# Patient Record
Sex: Female | Born: 1960
Health system: Southern US, Community
[De-identification: ages and names within clinical notes are randomized; demographics above are authoritative.]

## PROBLEM LIST (undated history)

## (undated) DIAGNOSIS — E559 Vitamin D deficiency, unspecified: Secondary | ICD-10-CM

## (undated) DIAGNOSIS — T7840XA Allergy, unspecified, initial encounter: Secondary | ICD-10-CM

## (undated) HISTORY — DX: Allergy, unspecified, initial encounter: T78.40XA

## (undated) HISTORY — PX: TUBAL LIGATION: SHX77

## (undated) HISTORY — DX: Vitamin D deficiency, unspecified: E55.9

## (undated) HISTORY — PX: WRIST SURGERY: SHX841

---

## 2004-01-15 ENCOUNTER — Ambulatory Visit: Payer: Self-pay | Admitting: General Practice

## 2005-01-25 ENCOUNTER — Ambulatory Visit: Payer: Self-pay | Admitting: General Practice

## 2005-02-07 ENCOUNTER — Ambulatory Visit: Payer: Self-pay | Admitting: Family Medicine

## 2006-02-07 ENCOUNTER — Ambulatory Visit: Payer: Self-pay | Admitting: General Practice

## 2006-08-04 ENCOUNTER — Emergency Department: Payer: Self-pay | Admitting: Emergency Medicine

## 2006-08-10 ENCOUNTER — Ambulatory Visit: Payer: Self-pay | Admitting: Unknown Physician Specialty

## 2006-08-11 ENCOUNTER — Ambulatory Visit: Payer: Self-pay | Admitting: Unknown Physician Specialty

## 2012-01-04 ENCOUNTER — Ambulatory Visit: Payer: Self-pay

## 2012-02-06 ENCOUNTER — Ambulatory Visit: Payer: Self-pay | Admitting: Gastroenterology

## 2012-02-06 LAB — HM COLONOSCOPY

## 2012-08-06 ENCOUNTER — Emergency Department: Payer: Self-pay | Admitting: Emergency Medicine

## 2012-08-06 LAB — COMPREHENSIVE METABOLIC PANEL
Albumin: 3.8 g/dL (ref 3.4–5.0)
BUN: 18 mg/dL (ref 7–18)
Bilirubin,Total: 0.5 mg/dL (ref 0.2–1.0)
Creatinine: 0.56 mg/dL — ABNORMAL LOW (ref 0.60–1.30)
Glucose: 112 mg/dL — ABNORMAL HIGH (ref 65–99)
Osmolality: 278 (ref 275–301)
Potassium: 4.2 mmol/L (ref 3.5–5.1)
SGPT (ALT): 33 U/L (ref 12–78)

## 2012-08-06 LAB — URINALYSIS, COMPLETE
Bilirubin,UR: NEGATIVE
Leukocyte Esterase: NEGATIVE
Protein: 30
Squamous Epithelial: 16

## 2012-08-06 LAB — CBC
HCT: 41.9 % (ref 35.0–47.0)
HGB: 14.7 g/dL (ref 12.0–16.0)
MCV: 88 fL (ref 80–100)
Platelet: 216 10*3/uL (ref 150–440)
RDW: 12.7 % (ref 11.5–14.5)

## 2012-08-06 LAB — TROPONIN I: Troponin-I: <0.02 ng/mL

## 2013-02-14 ENCOUNTER — Ambulatory Visit: Payer: Self-pay

## 2013-06-26 ENCOUNTER — Ambulatory Visit: Payer: Self-pay

## 2014-02-14 LAB — HM PAP SMEAR

## 2014-02-24 ENCOUNTER — Ambulatory Visit: Payer: Self-pay

## 2014-02-24 LAB — HM MAMMOGRAPHY

## 2014-07-16 ENCOUNTER — Ambulatory Visit (INDEPENDENT_AMBULATORY_CARE_PROVIDER_SITE_OTHER): Payer: Commercial Managed Care - PPO | Admitting: Podiatry

## 2014-07-16 ENCOUNTER — Ambulatory Visit (INDEPENDENT_AMBULATORY_CARE_PROVIDER_SITE_OTHER): Payer: Commercial Managed Care - PPO

## 2014-07-16 ENCOUNTER — Encounter: Payer: Self-pay | Admitting: Podiatry

## 2014-07-16 VITALS — BP 123/75 | HR 62 | Resp 16 | Ht 65.0 in | Wt 184.0 lb

## 2014-07-16 DIAGNOSIS — M2042 Other hammer toe(s) (acquired), left foot: Secondary | ICD-10-CM

## 2014-07-16 NOTE — Progress Notes (Signed)
   Subjective:    Patient ID: Jeanette Henry, female    DOB: 1960/05/18, 54 y.o.   MRN: [id number]  HPI the left pinky toe has a soft corn on it. It only hurts depending on the shoe i wear     Review of Systems  All other systems reviewed and are negative.      Objective:   Physical Exam: I have reviewed her past medical history medications allergies surgery social history and review of systems. Pulses are strongly palpable left foot. Neurologic sensorium is intact. Deep tendon reflexes intact bilateral muscle strength +5 over 5 dorsiflexion plantar flexors and inverters inverters MUSCULATURES intact. Orthopedic evaluation of his joints all joints distal to the ankle for range of motion or crepitation. Cutaneous evaluation of straight supple well-hydrated cutis adductor varus rotated hammertoe deformity with reactive hyperkeratosis to the medial aspect fifth digit left foot.        Assessment & Plan:  Assessment: Digital exostosis with adductor varus rotated hammertoe deformity fifth left. Porokeratosis fifth left.  Plan: Discussed etiology pathology conservative versus surgical therapies. Direct hyperkeratosis discussed surgery in detail follow-up with her as needed.

## 2014-12-08 DIAGNOSIS — E559 Vitamin D deficiency, unspecified: Secondary | ICD-10-CM | POA: Insufficient documentation

## 2014-12-09 ENCOUNTER — Ambulatory Visit (INDEPENDENT_AMBULATORY_CARE_PROVIDER_SITE_OTHER): Payer: Commercial Managed Care - PPO | Admitting: Unknown Physician Specialty

## 2014-12-09 ENCOUNTER — Encounter: Payer: Self-pay | Admitting: Unknown Physician Specialty

## 2014-12-09 ENCOUNTER — Ambulatory Visit: Payer: Commercial Managed Care - PPO | Admitting: Unknown Physician Specialty

## 2014-12-09 VITALS — BP 137/81 | HR 66 | Temp 97.9°F | Ht 63.9 in | Wt 187.5 lb

## 2014-12-09 DIAGNOSIS — Z0000 Encounter for general adult medical examination without abnormal findings: Secondary | ICD-10-CM | POA: Diagnosis not present

## 2014-12-09 LAB — CBC WITH DIFFERENTIAL/PLATELET
BASOS: 1 %
Basophils Absolute: 0 10*3/uL (ref 0.0–0.2)
EOS (ABSOLUTE): 0 10*3/uL (ref 0.0–0.4)
EOS: 0 %
HEMOGLOBIN: 14.2 g/dL (ref 11.1–15.9)
Hematocrit: 41 % (ref 34.0–46.6)
IMMATURE GRANS (ABS): 0 10*3/uL (ref 0.0–0.1)
IMMATURE GRANULOCYTES: 0 %
Lymphocytes Absolute: 2.1 10*3/uL (ref 0.7–3.1)
Lymphs: 30 %
MCH: 30.8 pg (ref 26.6–33.0)
MCHC: 34.6 g/dL (ref 31.5–35.7)
MCV: 89 fL (ref 79–97)
MONOCYTES: 5 %
Monocytes Absolute: 0.4 10*3/uL (ref 0.1–0.9)
NEUTROS PCT: 64 %
Neutrophils Absolute: 4.5 10*3/uL (ref 1.4–7.0)
PLATELETS: 242 10*3/uL (ref 150–379)
RBC: 4.61 x10E6/uL (ref 3.77–5.28)
RDW: 13.3 % (ref 12.3–15.4)
WBC: 7.1 10*3/uL (ref 3.4–10.8)

## 2014-12-09 NOTE — Progress Notes (Signed)
   BP 137/81 mmHg  Pulse 66  Temp(Src) 97.9 F (36.6 C)  Ht 5' 3.9" (1.623 m)  Wt 187 lb 8 oz (85.049 kg)  BMI 32.29 kg/m2  SpO2 97%  LMP  (LMP Unknown)   Subjective:    Patient ID: Jeanette Henry, female    DOB: May 16, 1960, 54 y.o.   MRN: [id number]  HPI: Jeanette Henry is a 54 y.o. female  Chief Complaint  Patient presents with  . Annual Exam    Relevant past medical, surgical, family and social history reviewed and updated as indicated. Interim medical history since our last visit reviewed. Allergies and medications reviewed and updated.  Review of Systems  Constitutional: Negative.   HENT: Negative.   Eyes: Negative.   Respiratory: Negative.   Cardiovascular: Negative.   Gastrointestinal: Negative.   Endocrine: Negative.   Genitourinary: Negative.   Musculoskeletal: Negative.   Skin: Negative.   Allergic/Immunologic: Negative.   Neurological: Negative.   Hematological: Negative.   Psychiatric/Behavioral: Negative.     Per HPI unless specifically indicated above     Objective:    BP 137/81 mmHg  Pulse 66  Temp(Src) 97.9 F (36.6 C)  Ht 5' 3.9" (1.623 m)  Wt 187 lb 8 oz (85.049 kg)  BMI 32.29 kg/m2  SpO2 97%  LMP  (LMP Unknown)  Wt Readings from Last 3 Encounters:  12/09/14 187 lb 8 oz (85.049 kg)  02/14/14 181 lb (82.101 kg)  07/16/14 184 lb (83.462 kg)    Physical Exam  Constitutional: She is oriented to person, place, and time. She appears well-developed and well-nourished.  HENT:  Head: Normocephalic and atraumatic.  Eyes: Pupils are equal, round, and reactive to light. Right eye exhibits no discharge. Left eye exhibits no discharge. No scleral icterus.  Neck: Normal range of motion. Neck supple. Carotid bruit is not present. No thyromegaly present.  Cardiovascular: Normal rate, regular rhythm and normal heart sounds.  Exam reveals no gallop and no friction rub.   No murmur heard. Pulmonary/Chest: Effort normal and breath sounds normal.  No respiratory distress. She has no wheezes. She has no rales.  Abdominal: Soft. Bowel sounds are normal. There is no tenderness. There is no rebound.  Genitourinary: No breast swelling, tenderness or discharge.  Musculoskeletal: Normal range of motion.  Lymphadenopathy:    She has no cervical adenopathy.  Neurological: She is alert and oriented to person, place, and time.  Skin: Skin is warm, dry and intact. No rash noted.  Psychiatric: She has a normal mood and affect. Her speech is normal and behavior is normal. Judgment and thought content normal. Cognition and memory are normal.  Nursing note reviewed.      Assessment & Plan:   Problem List Items Addressed This Visit    None    Visit Diagnoses    Annual physical exam    -  Primary        Follow up plan: Return if symptoms worsen or fail to improve.

## 2014-12-09 NOTE — Progress Notes (Signed)
   LMP  (LMP Unknown)   Subjective:    Patient ID: Jeanette Henry, female    DOB: November 01, 1960, 54 y.o.   MRN: [id number]  HPI: Jeanette Henry is a 54 y.o. female  Here for wellness physical  Relevant past medical, surgical, family and social history reviewed and updated as indicated. Interim medical history since our last visit reviewed. Allergies and medications reviewed and updated.  Depression screen PHQ 2/9 12/09/2014  Decreased Interest 2  Down, Depressed, Hopeless 0  PHQ - 2 Score 2    Review of Systems  Constitutional: Negative.   HENT: Negative.   Eyes: Negative.   Respiratory: Negative.   Cardiovascular: Negative.   Gastrointestinal: Negative.   Endocrine: Negative.   Genitourinary: Negative.   Musculoskeletal: Negative.   Skin: Negative.   Allergic/Immunologic: Negative.   Neurological: Negative.   Hematological: Negative.   Psychiatric/Behavioral: Negative.     Per HPI unless specifically indicated above     Objective:    LMP  (LMP Unknown)  Wt Readings from Last 3 Encounters:  12/09/14 187 lb 8 oz (85.049 kg)  02/14/14 181 lb (82.101 kg)  07/16/14 184 lb (83.462 kg)    Physical Exam  Constitutional: She is oriented to person, place, and time. She appears well-developed and well-nourished.  HENT:  Head: Normocephalic and atraumatic.  Eyes: Pupils are equal, round, and reactive to light. Right eye exhibits no discharge. Left eye exhibits no discharge. No scleral icterus.  Neck: Normal range of motion. Neck supple. Carotid bruit is not present. No thyromegaly present.  Cardiovascular: Normal rate, regular rhythm and normal heart sounds.  Exam reveals no gallop and no friction rub.   No murmur heard. Pulmonary/Chest: Effort normal and breath sounds normal. No respiratory distress. She has no wheezes. She has no rales.  Abdominal: Soft. Bowel sounds are normal. There is no tenderness. There is no rebound.  Genitourinary: No breast swelling,  tenderness or discharge.  Musculoskeletal: Normal range of motion.  Lymphadenopathy:    She has no cervical adenopathy.  Neurological: She is alert and oriented to person, place, and time.  Skin: Skin is warm, dry and intact. No rash noted.  Psychiatric: She has a normal mood and affect. Her speech is normal and behavior is normal. Judgment and thought content normal. Cognition and memory are normal.       Assessment & Plan:   Problem List Items Addressed This Visit    None    Visit Diagnoses    Annual physical exam    -  Primary    Relevant Orders    CBC with Differential/Platelet    Comprehensive metabolic panel    Hepatitis C antibody    HIV antibody    TSH    Lipid Panel w/o Chol/HDL Ratio        Follow up plan: Return in about 1 year (around 12/09/2015).

## 2014-12-10 ENCOUNTER — Encounter: Payer: Self-pay | Admitting: Unknown Physician Specialty

## 2014-12-10 LAB — COMPREHENSIVE METABOLIC PANEL
A/G RATIO: 1.8 (ref 1.1–2.5)
ALK PHOS: 88 IU/L (ref 39–117)
ALT: 23 IU/L (ref 0–32)
AST: 24 IU/L (ref 0–40)
Albumin: 4.4 g/dL (ref 3.5–5.5)
BILIRUBIN TOTAL: 0.2 mg/dL (ref 0.0–1.2)
BUN/Creatinine Ratio: 18 (ref 9–23)
BUN: 14 mg/dL (ref 6–24)
CHLORIDE: 99 mmol/L (ref 97–108)
CO2: 26 mmol/L (ref 18–29)
Calcium: 9.6 mg/dL (ref 8.7–10.2)
Creatinine, Ser: 0.78 mg/dL (ref 0.57–1.00)
GFR calc Af Amer: 100 mL/min/{1.73_m2} (ref >59)
GFR calc non Af Amer: 87 mL/min/{1.73_m2} (ref >59)
GLUCOSE: 90 mg/dL (ref 65–99)
Globulin, Total: 2.4 g/dL (ref 1.5–4.5)
POTASSIUM: 4.6 mmol/L (ref 3.5–5.2)
Sodium: 139 mmol/L (ref 134–144)
Total Protein: 6.8 g/dL (ref 6.0–8.5)

## 2014-12-10 LAB — LIPID PANEL W/O CHOL/HDL RATIO
Cholesterol, Total: 219 mg/dL — ABNORMAL HIGH (ref 100–199)
HDL: 55 mg/dL (ref >39)
LDL Calculated: 133 mg/dL — ABNORMAL HIGH (ref 0–99)
Triglycerides: 154 mg/dL — ABNORMAL HIGH (ref 0–149)
VLDL Cholesterol Cal: 31 mg/dL (ref 5–40)

## 2014-12-10 LAB — HEPATITIS C ANTIBODY

## 2014-12-10 LAB — TSH: TSH: 1.88 u[IU]/mL (ref 0.450–4.500)

## 2014-12-10 LAB — HIV ANTIBODY (ROUTINE TESTING W REFLEX): HIV SCREEN 4TH GENERATION: NONREACTIVE

## 2015-01-06 ENCOUNTER — Ambulatory Visit (INDEPENDENT_AMBULATORY_CARE_PROVIDER_SITE_OTHER): Payer: Commercial Managed Care - PPO | Admitting: Unknown Physician Specialty

## 2015-01-06 ENCOUNTER — Encounter: Payer: Self-pay | Admitting: Unknown Physician Specialty

## 2015-01-06 VITALS — BP 125/81 | HR 65 | Temp 97.8°F | Ht 65.5 in | Wt 186.8 lb

## 2015-01-06 DIAGNOSIS — J019 Acute sinusitis, unspecified: Secondary | ICD-10-CM | POA: Diagnosis not present

## 2015-01-06 MED ORDER — AMOXICILLIN 875 MG PO TABS: 875.0000 mg | ORAL_TABLET | Freq: Two times a day (BID) | ORAL | Status: DC

## 2015-01-06 MED ORDER — FLUCONAZOLE 150 MG PO TABS: 150.0000 mg | ORAL_TABLET | Freq: Once | ORAL | Status: DC

## 2015-01-06 NOTE — Progress Notes (Signed)
    BP 125/81 mmHg  Pulse 65  Temp(Src) 97.8 F (36.6 C)  Ht 5' 5.5" (1.664 m)  Wt 186 lb 12.8 oz (84.732 kg)  BMI 30.60 kg/m2  SpO2 95%  LMP  (LMP Unknown)   Subjective:    Patient ID: Jeanette Henry, female    DOB: 05-Jun-1960, 54 y.o.   MRN: [id number]  HPI: Jeanette Henry is a 54 y.o. female  Chief Complaint  Patient presents with  . Sinusitis    pt states she has nasal congestion, drainage, cough, headache, and sore throat. States symptoms started over a week ago. States she has tried taking Mucinex and Sudaffed, but neither seemed to help   Sinusitis This is a new problem. The current episode started 1 to 4 weeks ago. The problem has been waxing and waning since onset. There has been no fever. She is experiencing no pain. Associated symptoms include chills, congestion, coughing, ear pain, headaches, a hoarse voice, sinus pressure and a sore throat. Past treatments include oral decongestants. The treatment provided no relief.    Relevant past medical, surgical, family and social history reviewed and updated as indicated. Interim medical history since our last visit reviewed. Allergies and medications reviewed and updated.  Review of Systems  Constitutional: Positive for chills.  HENT: Positive for congestion, ear pain, hoarse voice, sinus pressure and sore throat.   Respiratory: Positive for cough.   Neurological: Positive for headaches.    Per HPI unless specifically indicated above     Objective:    BP 125/81 mmHg  Pulse 65  Temp(Src) 97.8 F (36.6 C)  Ht 5' 5.5" (1.664 m)  Wt 186 lb 12.8 oz (84.732 kg)  BMI 30.60 kg/m2  SpO2 95%  LMP  (LMP Unknown)  Wt Readings from Last 3 Encounters:  01/06/15 186 lb 12.8 oz (84.732 kg)  12/09/14 187 lb 8 oz (85.049 kg)  02/14/14 181 lb (82.101 kg)    Physical Exam  Constitutional: She is oriented to person, place, and time. She appears well-developed and well-nourished. No distress.  HENT:  Head: Normocephalic  and atraumatic.  Right Ear: Tympanic membrane and ear canal normal.  Left Ear: Tympanic membrane and ear canal normal.  Nose: No rhinorrhea. Right sinus exhibits maxillary sinus tenderness. Right sinus exhibits no frontal sinus tenderness. Left sinus exhibits maxillary sinus tenderness. Left sinus exhibits no frontal sinus tenderness.  Eyes: Conjunctivae and lids are normal. Right eye exhibits no discharge. Left eye exhibits no discharge. No scleral icterus.  Cardiovascular: Normal rate and regular rhythm.   Pulmonary/Chest: Effort normal and breath sounds normal. No respiratory distress.  Abdominal: Normal appearance. There is no splenomegaly or hepatomegaly.  Musculoskeletal: Normal range of motion.  Neurological: She is alert and oriented to person, place, and time.  Skin: Skin is intact. No rash noted. No pallor.  Psychiatric: She has a normal mood and affect. Her behavior is normal. Judgment and thought content normal.      Assessment & Plan:   Problem List Items Addressed This Visit    None    Visit Diagnoses    Acute sinusitis, recurrence not specified, unspecified location    -  Primary    Relevant Medications    amoxicillin (AMOXIL) 875 MG tablet    fluconazole (DIFLUCAN) 150 MG tablet        Follow up plan: Return if symptoms worsen or fail to improve.

## 2015-10-20 ENCOUNTER — Encounter: Payer: Self-pay | Admitting: Unknown Physician Specialty

## 2015-10-20 ENCOUNTER — Ambulatory Visit (INDEPENDENT_AMBULATORY_CARE_PROVIDER_SITE_OTHER): Payer: Commercial Managed Care - PPO | Admitting: Unknown Physician Specialty

## 2015-10-20 VITALS — BP 122/76 | HR 67 | Temp 97.7°F | Ht 64.2 in | Wt 185.2 lb

## 2015-10-20 DIAGNOSIS — Z0000 Encounter for general adult medical examination without abnormal findings: Secondary | ICD-10-CM | POA: Diagnosis not present

## 2015-10-20 DIAGNOSIS — J309 Allergic rhinitis, unspecified: Secondary | ICD-10-CM | POA: Diagnosis not present

## 2015-10-20 DIAGNOSIS — Z23 Encounter for immunization: Secondary | ICD-10-CM

## 2015-10-20 DIAGNOSIS — B373 Candidiasis of vulva and vagina: Secondary | ICD-10-CM

## 2015-10-20 DIAGNOSIS — M791 Myalgia, unspecified site: Secondary | ICD-10-CM

## 2015-10-20 DIAGNOSIS — B3731 Acute candidiasis of vulva and vagina: Secondary | ICD-10-CM

## 2015-10-20 MED ORDER — FLUTICASONE PROPIONATE 50 MCG/ACT NA SUSP: 2.0000 | Freq: Every day | NASAL | 12 refills | Status: DC

## 2015-10-20 MED ORDER — FLUCONAZOLE 150 MG PO TABS: 150.0000 mg | ORAL_TABLET | Freq: Once | ORAL | 0 refills | Status: AC

## 2015-10-20 MED ORDER — FEXOFENADINE HCL 180 MG PO TABS: 180.0000 mg | ORAL_TABLET | Freq: Every day | ORAL | 3 refills | Status: DC

## 2015-10-20 NOTE — Patient Instructions (Signed)
Tdap Vaccine (Tetanus, Diphtheria and Pertussis): What You Need to Know 1. Why get vaccinated? Tetanus, diphtheria and pertussis are very serious diseases. Tdap vaccine can protect us from these diseases. And, Tdap vaccine given to pregnant women can protect newborn babies against pertussis. TETANUS (Lockjaw) is rare in the [place] today. It causes painful muscle tightening and stiffness, usually all over the body.  It can lead to tightening of muscles in the head and neck so you can't open your mouth, swallow, or sometimes even breathe. Tetanus kills about 1 out of 10 people who are infected even after receiving the best medical care. DIPHTHERIA is also rare in the [place] today. It can cause a thick coating to form in the back of the throat.  It can lead to breathing problems, heart failure, paralysis, and death. PERTUSSIS (Whooping Cough) causes severe coughing spells, which can cause difficulty breathing, vomiting and disturbed sleep.  It can also lead to weight loss, incontinence, and rib fractures. Up to 2 in 100 adolescents and 5 in 100 adults with pertussis are hospitalized or have complications, which could include pneumonia or death. These diseases are caused by bacteria. Diphtheria and pertussis are spread from person to person through secretions from coughing or sneezing. Tetanus enters the body through cuts, scratches, or wounds. Before vaccines, as many as 200,000 cases of diphtheria, 200,000 cases of pertussis, and hundreds of cases of tetanus, were reported in the [place] each year. Since vaccination began, reports of cases for tetanus and diphtheria have dropped by about 99% and for pertussis by about 80%. 2. Tdap vaccine Tdap vaccine can protect adolescents and adults from tetanus, diphtheria, and pertussis. One dose of Tdap is routinely given at age 11 or 12. People who did not get Tdap at that age should get it as soon as possible. Tdap is especially important  for healthcare professionals and anyone having close contact with a baby younger than 12 months. Pregnant women should get a dose of Tdap during every pregnancy, to protect the newborn from pertussis. Infants are most at risk for severe, life-threatening complications from pertussis. Another vaccine, called Td, protects against tetanus and diphtheria, but not pertussis. A Td booster should be given every 10 years. Tdap may be given as one of these boosters if you have never gotten Tdap before. Tdap may also be given after a severe cut or burn to prevent tetanus infection. Your doctor or the person giving you the vaccine can give you more information. Tdap may safely be given at the same time as other vaccines. 3. Some people should not get this vaccine  A person who has ever had a life-threatening allergic reaction after a previous dose of any diphtheria, tetanus or pertussis containing vaccine, OR has a severe allergy to any part of this vaccine, should not get Tdap vaccine. Tell the person giving the vaccine about any severe allergies.  Anyone who had coma or long repeated seizures within 7 days after a childhood dose of DTP or DTaP, or a previous dose of Tdap, should not get Tdap, unless a cause other than the vaccine was found. They can still get Td.  Talk to your doctor if you:  have seizures or another nervous system problem,  had severe pain or swelling after any vaccine containing diphtheria, tetanus or pertussis,  ever had a condition called Guillain-Barr Syndrome (GBS),  aren't feeling well on the day the shot is scheduled. 4. Risks With any medicine, including vaccines, there is   a chance of side effects. These are usually mild and go away on their own. Serious reactions are also possible but are rare. Most people who get Tdap vaccine do not have any problems with it. Mild problems following Tdap (Did not interfere with activities)  Pain where the shot was given (about 3 in 4  adolescents or 2 in 3 adults)  Redness or swelling where the shot was given (about 1 person in 5)  Mild fever of at least 100.4F (up to about 1 in 25 adolescents or 1 in 100 adults)  Headache (about 3 or 4 people in 10)  Tiredness (about 1 person in 3 or 4)  Nausea, vomiting, diarrhea, stomach ache (up to 1 in 4 adolescents or 1 in 10 adults)  Chills, sore joints (about 1 person in 10)  Body aches (about 1 person in 3 or 4)  Rash, swollen glands (uncommon) Moderate problems following Tdap (Interfered with activities, but did not require medical attention)  Pain where the shot was given (up to 1 in 5 or 6)  Redness or swelling where the shot was given (up to about 1 in 16 adolescents or 1 in 12 adults)  Fever over 102F (about 1 in 100 adolescents or 1 in 250 adults)  Headache (about 1 in 7 adolescents or 1 in 10 adults)  Nausea, vomiting, diarrhea, stomach ache (up to 1 or 3 people in 100)  Swelling of the entire arm where the shot was given (up to about 1 in 500). Severe problems following Tdap (Unable to perform usual activities; required medical attention)  Swelling, severe pain, bleeding and redness in the arm where the shot was given (rare). Problems that could happen after any vaccine:  People sometimes faint after a medical procedure, including vaccination. Sitting or lying down for about 15 minutes can help prevent fainting, and injuries caused by a fall. Tell your doctor if you feel dizzy, or have vision changes or ringing in the ears.  Some people get severe pain in the shoulder and have difficulty moving the arm where a shot was given. This happens very rarely.  Any medication can cause a severe allergic reaction. Such reactions from a vaccine are very rare, estimated at fewer than 1 in a million doses, and would happen within a few minutes to a few hours after the vaccination. As with any medicine, there is a very remote chance of a vaccine causing a serious  injury or death. The safety of vaccines is always being monitored. For more information, visit: www.cdc.gov/vaccinesafety/ 5. What if there is a serious problem? What should I look for?  Look for anything that concerns you, such as signs of a severe allergic reaction, very high fever, or unusual behavior.  Signs of a severe allergic reaction can include hives, swelling of the face and throat, difficulty breathing, a fast heartbeat, dizziness, and weakness. These would usually start a few minutes to a few hours after the vaccination. What should I do?  If you think it is a severe allergic reaction or other emergency that can't wait, call 9-1-1 or get the person to the nearest hospital. Otherwise, call your doctor.  Afterward, the reaction should be reported to the Vaccine Adverse Event Reporting System (VAERS). Your doctor might file this report, or you can do it yourself through the VAERS web site at www.vaers.hhs.gov, or by calling 1-800-822-7967. VAERS does not give medical advice.  6. The National Vaccine Injury Compensation Program The National Vaccine Injury Compensation Program (  VICP) is a federal program that was created to compensate people who may have been injured by certain vaccines. Persons who believe they may have been injured by a vaccine can learn about the program and about filing a claim by calling 1-800-338-2382 or visiting the VICP website at www.hrsa.gov/vaccinecompensation. There is a time limit to file a claim for compensation. 7. How can I learn more?  Ask your doctor. He or she can give you the vaccine package insert or suggest other sources of information.  Call your local or state health department.  Contact the Centers for Disease Control and Prevention (CDC):  Call 1-800-232-4636 (1-800-CDC-INFO) or  Visit CDC's website at www.cdc.gov/vaccines CDC Tdap Vaccine VIS (04/23/13)   This information is not intended to replace advice given to you by your health care  provider. Make sure you discuss any questions you have with your health care provider.   Document Released: 08/16/2011 Document Revised: 03/07/2014 Document Reviewed: 05/29/2013 [employer] Interactive Patient Education 2016 [employer]  

## 2015-10-20 NOTE — Progress Notes (Signed)
BP 122/76 (BP Location: Left Arm, Patient Position: Sitting, Cuff Size: Large)   Pulse 67   Temp 97.7 F (36.5 C)   Ht 5' 4.2" (1.631 m)   Wt 185 lb 3.2 oz (84 kg)   LMP  (LMP Unknown)   SpO2 97%   BMI 31.59 kg/m    Subjective:    Patient ID: Jeanette Henry, female    DOB: Jan 04, 1961, 55 y.o.   MRN: [id number]  HPI: Jeanette Henry is a 54 y.o. female  Chief Complaint  Patient presents with  . Annual Exam   Social History   Social History  . Marital status: Married    Spouse name: N/A  . Number of children: N/A  . Years of education: N/A   Occupational History  . Not on file.   Social History Main Topics  . Smoking status: Never Smoker  . Smokeless tobacco: Never Used  . Alcohol use No  . Drug use: No  . Sexual activity: Yes    Birth control/ protection: Surgical   Other Topics Concern  . Not on file   Social History Narrative  . No narrative on file   Family History  Problem Relation Age of Onset  . Hypertension Mother   . Cancer Mother     skin  . Heart disease Father    Past Medical History:  Diagnosis Date  . Allergy   . Vitamin D deficiency    Past Surgical History:  Procedure Laterality Date  . TUBAL LIGATION    . WRIST SURGERY Left    Depression screen [facility] 2/9 10/20/2015 12/09/2014  Decreased Interest 0 2  Down, Depressed, Hopeless 0 0  PHQ - 2 Score 0 2   Allergies Wanting something less expensive.  Allegra seems to help  Relevant past medical, surgical, family and social history reviewed and updated as indicated. Interim medical history since our last visit reviewed. Allergies and medications reviewed and updated.  Review of Systems  Constitutional: Negative.   HENT: Negative.   Eyes: Negative.   Respiratory: Negative.   Cardiovascular: Negative.   Gastrointestinal: Negative.   Endocrine: Negative.   Genitourinary: Negative.        Vaginal irritation  Musculoskeletal:       General aches and pains  Neurological:  Negative.   Psychiatric/Behavioral: Negative.     Per HPI unless specifically indicated above     Objective:    BP 122/76 (BP Location: Left Arm, Patient Position: Sitting, Cuff Size: Large)   Pulse 67   Temp 97.7 F (36.5 C)   Ht 5' 4.2" (1.631 m)   Wt 185 lb 3.2 oz (84 kg)   LMP  (LMP Unknown)   SpO2 97%   BMI 31.59 kg/m   Wt Readings from Last 3 Encounters:  10/20/15 185 lb 3.2 oz (84 kg)  01/06/15 186 lb 12.8 oz (84.7 kg)  12/09/14 187 lb 8 oz (85 kg)    Physical Exam  Constitutional: She is oriented to person, place, and time. She appears well-developed and well-nourished.  HENT:  Head: Normocephalic and atraumatic.  Eyes: Pupils are equal, round, and reactive to light. Right eye exhibits no discharge. Left eye exhibits no discharge. No scleral icterus.  Neck: Normal range of motion. Neck supple. Carotid bruit is not present. No thyromegaly present.  Cardiovascular: Normal rate, regular rhythm and normal heart sounds.  Exam reveals no gallop and no friction rub.   No murmur heard. Pulmonary/Chest: Effort normal and breath sounds  normal. No respiratory distress. She has no wheezes. She has no rales.  Abdominal: Soft. Bowel sounds are normal. There is no tenderness. There is no rebound.  Genitourinary: No breast swelling, tenderness or discharge. There is erythema in the vagina.  Musculoskeletal: Normal range of motion.  Lymphadenopathy:    She has no cervical adenopathy.  Neurological: She is alert and oriented to person, place, and time.  Skin: Skin is warm, dry and intact. No rash noted.  Psychiatric: She has a normal mood and affect. Her speech is normal and behavior is normal. Judgment and thought content normal. Cognition and memory are normal.    Results for orders placed or performed in visit on 12/09/14  CBC with Differential/Platelet  Result Value Ref Range   WBC 7.1 3.4 - 10.8 x10E3/uL   RBC 4.61 3.77 - 5.28 x10E6/uL   Hemoglobin 14.2 11.1 - 15.9 g/dL    Hematocrit 41.0 34.0 - 46.6 %   MCV 89 79 - 97 fL   MCH 30.8 26.6 - 33.0 pg   MCHC 34.6 31.5 - 35.7 g/dL   RDW 13.3 12.3 - 15.4 %   Platelets 242 150 - 379 x10E3/uL   Neutrophils 64 %   Lymphs 30 %   Monocytes 5 %   Eos 0 %   Basos 1 %   Neutrophils Absolute 4.5 1.4 - 7.0 x10E3/uL   Lymphocytes Absolute 2.1 0.7 - 3.1 x10E3/uL   Monocytes Absolute 0.4 0.1 - 0.9 x10E3/uL   EOS (ABSOLUTE) 0.0 0.0 - 0.4 x10E3/uL   Basophils Absolute 0.0 0.0 - 0.2 x10E3/uL   Immature Granulocytes 0 %   Immature Grans (Abs) 0.0 0.0 - 0.1 x10E3/uL  Comprehensive metabolic panel  Result Value Ref Range   Glucose 90 65 - 99 mg/dL   BUN 14 6 - 24 mg/dL   Creatinine, Ser 0.78 0.57 - 1.00 mg/dL   GFR calc non Af Amer 87 >59 mL/min/1.73   GFR calc Af Amer 100 >59 mL/min/1.73   BUN/Creatinine Ratio 18 9 - 23   Sodium 139 134 - 144 mmol/L   Potassium 4.6 3.5 - 5.2 mmol/L   Chloride 99 97 - 108 mmol/L   CO2 26 18 - 29 mmol/L   Calcium 9.6 8.7 - 10.2 mg/dL   Total Protein 6.8 6.0 - 8.5 g/dL   Albumin 4.4 3.5 - 5.5 g/dL   Globulin, Total 2.4 1.5 - 4.5 g/dL   Albumin/Globulin Ratio 1.8 1.1 - 2.5   Bilirubin Total 0.2 0.0 - 1.2 mg/dL   Alkaline Phosphatase 88 39 - 117 IU/L   AST 24 0 - 40 IU/L   ALT 23 0 - 32 IU/L  Hepatitis C antibody  Result Value Ref Range   Hep C Virus Ab <0.1 0.0 - 0.9 s/co ratio  HIV antibody  Result Value Ref Range   HIV Screen 4th Generation wRfx Non Reactive Non Reactive  TSH  Result Value Ref Range   TSH 1.880 0.450 - 4.500 uIU/mL  Lipid Panel w/o Chol/HDL Ratio  Result Value Ref Range   Cholesterol, Total 219 (H) 100 - 199 mg/dL   Triglycerides 154 (H) 0 - 149 mg/dL   HDL 55 >39 mg/dL   VLDL Cholesterol Cal 31 5 - 40 mg/dL   LDL Calculated 133 (H) 0 - 99 mg/dL      Assessment & Plan:   Problem List Items Addressed This Visit      Unprioritized   Allergic rhinitis   Relevant Medications  fluticasone (FLONASE) 50 MCG/ACT nasal spray    Other Visit Diagnoses     Need for diphtheria-tetanus-pertussis (Tdap) vaccine, adult/adolescent    -  Primary   Relevant Orders   Tdap vaccine greater than or equal to 7yo IM (Completed)   Annual physical exam       Relevant Orders   VITAMIN D 25 Hydroxy (Vit-D Deficiency, Fractures)   CBC with Differential/Platelet   Comprehensive metabolic panel   Lipid Panel w/o Chol/HDL Ratio   TSH   MM DIGITAL SCREENING BILATERAL   Vaginal yeast infection       Relevant Medications   fluconazole (DIFLUCAN) 150 MG tablet   Myalgia       Relevant Orders   VITAMIN D 25 Hydroxy (Vit-D Deficiency, Fractures)       Follow up plan: Return if symptoms worsen or fail to improve.

## 2015-10-21 ENCOUNTER — Encounter: Payer: Self-pay | Admitting: Unknown Physician Specialty

## 2015-10-21 LAB — COMPREHENSIVE METABOLIC PANEL
ALBUMIN: 4.5 g/dL (ref 3.5–5.5)
ALT: 14 IU/L (ref 0–32)
AST: 19 IU/L (ref 0–40)
Albumin/Globulin Ratio: 2.1 (ref 1.2–2.2)
Alkaline Phosphatase: 81 IU/L (ref 39–117)
BUN / CREAT RATIO: 27 — AB (ref 9–23)
BUN: 19 mg/dL (ref 6–24)
Bilirubin Total: 0.3 mg/dL (ref 0.0–1.2)
CALCIUM: 9.4 mg/dL (ref 8.7–10.2)
CO2: 23 mmol/L (ref 18–29)
CREATININE: 0.7 mg/dL (ref 0.57–1.00)
Chloride: 101 mmol/L (ref 96–106)
GFR, EST AFRICAN AMERICAN: 114 mL/min/{1.73_m2} (ref >59)
GFR, EST NON AFRICAN AMERICAN: 99 mL/min/{1.73_m2} (ref >59)
GLOBULIN, TOTAL: 2.1 g/dL (ref 1.5–4.5)
Glucose: 89 mg/dL (ref 65–99)
Potassium: 4 mmol/L (ref 3.5–5.2)
SODIUM: 141 mmol/L (ref 134–144)
TOTAL PROTEIN: 6.6 g/dL (ref 6.0–8.5)

## 2015-10-21 LAB — CBC WITH DIFFERENTIAL/PLATELET
BASOS: 0 %
Basophils Absolute: 0 10*3/uL (ref 0.0–0.2)
EOS (ABSOLUTE): 0 10*3/uL (ref 0.0–0.4)
EOS: 0 %
HEMATOCRIT: 39.8 % (ref 34.0–46.6)
HEMOGLOBIN: 13.6 g/dL (ref 11.1–15.9)
IMMATURE GRANULOCYTES: 0 %
Immature Grans (Abs): 0 10*3/uL (ref 0.0–0.1)
LYMPHS ABS: 2 10*3/uL (ref 0.7–3.1)
Lymphs: 23 %
MCH: 30.4 pg (ref 26.6–33.0)
MCHC: 34.2 g/dL (ref 31.5–35.7)
MCV: 89 fL (ref 79–97)
MONOCYTES: 4 %
Monocytes Absolute: 0.4 10*3/uL (ref 0.1–0.9)
NEUTROS PCT: 73 %
Neutrophils Absolute: 6.4 10*3/uL (ref 1.4–7.0)
Platelets: 241 10*3/uL (ref 150–379)
RBC: 4.48 x10E6/uL (ref 3.77–5.28)
RDW: 13.8 % (ref 12.3–15.4)
WBC: 8.8 10*3/uL (ref 3.4–10.8)

## 2015-10-21 LAB — LIPID PANEL W/O CHOL/HDL RATIO
CHOLESTEROL TOTAL: 205 mg/dL — AB (ref 100–199)
HDL: 55 mg/dL (ref >39)
LDL Calculated: 122 mg/dL — ABNORMAL HIGH (ref 0–99)
Triglycerides: 139 mg/dL (ref 0–149)
VLDL CHOLESTEROL CAL: 28 mg/dL (ref 5–40)

## 2015-10-21 LAB — TSH: TSH: 1.18 u[IU]/mL (ref 0.450–4.500)

## 2015-10-21 LAB — VITAMIN D 25 HYDROXY (VIT D DEFICIENCY, FRACTURES): Vit D, 25-Hydroxy: 24.7 ng/mL — ABNORMAL LOW (ref 30.0–100.0)

## 2015-12-11 ENCOUNTER — Encounter: Payer: Commercial Managed Care - PPO | Admitting: Unknown Physician Specialty

## 2016-06-13 ENCOUNTER — Encounter: Payer: Self-pay | Admitting: Unknown Physician Specialty

## 2016-06-13 ENCOUNTER — Ambulatory Visit (INDEPENDENT_AMBULATORY_CARE_PROVIDER_SITE_OTHER): Payer: Commercial Managed Care - PPO | Admitting: Unknown Physician Specialty

## 2016-06-13 VITALS — BP 128/85 | HR 66 | Temp 98.2°F | Ht 64.1 in | Wt 184.9 lb

## 2016-06-13 DIAGNOSIS — G4700 Insomnia, unspecified: Secondary | ICD-10-CM | POA: Insufficient documentation

## 2016-06-13 DIAGNOSIS — F5101 Primary insomnia: Secondary | ICD-10-CM | POA: Diagnosis not present

## 2016-06-13 DIAGNOSIS — Z0001 Encounter for general adult medical examination with abnormal findings: Secondary | ICD-10-CM

## 2016-06-13 DIAGNOSIS — F320 Major depressive disorder, single episode, mild: Secondary | ICD-10-CM | POA: Insufficient documentation

## 2016-06-13 DIAGNOSIS — Z780 Asymptomatic menopausal state: Secondary | ICD-10-CM | POA: Diagnosis not present

## 2016-06-13 DIAGNOSIS — Z0000 Encounter for general adult medical examination without abnormal findings: Secondary | ICD-10-CM | POA: Diagnosis not present

## 2016-06-13 MED ORDER — CITALOPRAM HYDROBROMIDE 20 MG PO TABS: 20.0000 mg | ORAL_TABLET | Freq: Every day | ORAL | 3 refills | Status: DC

## 2016-06-13 NOTE — Assessment & Plan Note (Signed)
New problem.  Start Citalopram

## 2016-06-13 NOTE — Progress Notes (Signed)
BP 128/85 (BP Location: Left Arm, Patient Position: Sitting, Cuff Size: Large)   Pulse 66   Temp 98.2 F (36.8 C)   Ht 5' 4.1" (1.628 m)   Wt 184 lb 14.4 oz (83.9 kg)   LMP  (LMP Unknown)   SpO2 96%   BMI 31.64 kg/m    Subjective:    Patient ID: Jeanette Henry, female    DOB: 1960/09/11, 56 y.o.   MRN: [id number]  HPI: Jeanette Henry is a 56 y.o. female  Chief Complaint  Patient presents with  . Annual Exam   Depression screen [facility] 2/9 06/13/2016 10/20/2015 12/09/2014  Decreased Interest 2 0 2  Down, Depressed, Hopeless 2 0 0  PHQ - 2 Score 4 0 2  Altered sleeping 3 - -  Tired, decreased energy 1 - -  Change in appetite 0 - -  Feeling bad or failure about yourself  1 - -  Trouble concentrating 0 - -  Moving slowly or fidgety/restless 0 - -  Suicidal thoughts 0 - -  PHQ-9 Score 9 - -   Social History   Social History  . Marital status: Married    Spouse name: N/A  . Number of children: N/A  . Years of education: N/A   Occupational History  . Not on file.   Social History Main Topics  . Smoking status: Never Smoker  . Smokeless tobacco: Never Used  . Alcohol use No  . Drug use: No  . Sexual activity: Yes    Birth control/ protection: Surgical   Other Topics Concern  . Not on file   Social History Narrative  . No narrative on file   Family History  Problem Relation Age of Onset  . Hypertension Mother   . Cancer Mother     skin  . Heart disease Father    Past Medical History:  Diagnosis Date  . Allergy   . Vitamin D deficiency    Past Surgical History:  Procedure Laterality Date  . TUBAL LIGATION    . WRIST SURGERY Left    Hot flashes Having bad hot flashes and feels like she aches all over.   Relevant past medical, surgical, family and social history reviewed and updated as indicated. Interim medical history since our last visit reviewed. Allergies and medications reviewed and updated.  Review of Systems  Constitutional: Negative.    HENT: Negative.   Eyes: Negative.   Respiratory: Negative.   Cardiovascular: Negative.   Gastrointestinal: Negative.   Endocrine: Negative.   Genitourinary:       Hot flashes, particularly at night  Musculoskeletal: Negative.   Skin: Negative.   Allergic/Immunologic: Negative.   Neurological: Negative.   Hematological: Negative.   Psychiatric/Behavioral: Positive for sleep disturbance.    Per HPI unless specifically indicated above     Objective:    BP 128/85 (BP Location: Left Arm, Patient Position: Sitting, Cuff Size: Large)   Pulse 66   Temp 98.2 F (36.8 C)   Ht 5' 4.1" (1.628 m)   Wt 184 lb 14.4 oz (83.9 kg)   LMP  (LMP Unknown)   SpO2 96%   BMI 31.64 kg/m   Wt Readings from Last 3 Encounters:  06/13/16 184 lb 14.4 oz (83.9 kg)  10/20/15 185 lb 3.2 oz (84 kg)  01/06/15 186 lb 12.8 oz (84.7 kg)    Physical Exam  Constitutional: She is oriented to person, place, and time. She appears well-developed and well-nourished.  HENT:  Head:  Normocephalic and atraumatic.  Eyes: Pupils are equal, round, and reactive to light. Right eye exhibits no discharge. Left eye exhibits no discharge. No scleral icterus.  Neck: Normal range of motion. Neck supple. Carotid bruit is not present. No thyromegaly present.  Cardiovascular: Normal rate, regular rhythm and normal heart sounds.  Exam reveals no gallop and no friction rub.   No murmur heard. Pulmonary/Chest: Effort normal and breath sounds normal. No respiratory distress. She has no wheezes. She has no rales.  Abdominal: Soft. Bowel sounds are normal. There is no tenderness. There is no rebound.  Genitourinary: No breast swelling, tenderness or discharge.  Musculoskeletal: Normal range of motion.  Lymphadenopathy:    She has no cervical adenopathy.  Neurological: She is alert and oriented to person, place, and time.  Skin: Skin is warm, dry and intact. No rash noted.  Psychiatric: She has a normal mood and affect. Her speech  is normal and behavior is normal. Judgment and thought content normal. Cognition and memory are normal.    Results for orders placed or performed in visit on 10/20/15  VITAMIN D 25 Hydroxy (Vit-D Deficiency, Fractures)  Result Value Ref Range   Vit D, 25-Hydroxy 24.7 (L) 30.0 - 100.0 ng/mL  CBC with Differential/Platelet  Result Value Ref Range   WBC 8.8 3.4 - 10.8 x10E3/uL   RBC 4.48 3.77 - 5.28 x10E6/uL   Hemoglobin 13.6 11.1 - 15.9 g/dL   Hematocrit 39.8 34.0 - 46.6 %   MCV 89 79 - 97 fL   MCH 30.4 26.6 - 33.0 pg   MCHC 34.2 31.5 - 35.7 g/dL   RDW 13.8 12.3 - 15.4 %   Platelets 241 150 - 379 x10E3/uL   Neutrophils 73 %   Lymphs 23 %   Monocytes 4 %   Eos 0 %   Basos 0 %   Neutrophils Absolute 6.4 1.4 - 7.0 x10E3/uL   Lymphocytes Absolute 2.0 0.7 - 3.1 x10E3/uL   Monocytes Absolute 0.4 0.1 - 0.9 x10E3/uL   EOS (ABSOLUTE) 0.0 0.0 - 0.4 x10E3/uL   Basophils Absolute 0.0 0.0 - 0.2 x10E3/uL   Immature Granulocytes 0 %   Immature Grans (Abs) 0.0 0.0 - 0.1 x10E3/uL  Comprehensive metabolic panel  Result Value Ref Range   Glucose 89 65 - 99 mg/dL   BUN 19 6 - 24 mg/dL   Creatinine, Ser 0.70 0.57 - 1.00 mg/dL   GFR calc non Af Amer 99 >59 mL/min/1.73   GFR calc Af Amer 114 >59 mL/min/1.73   BUN/Creatinine Ratio 27 (H) 9 - 23   Sodium 141 134 - 144 mmol/L   Potassium 4.0 3.5 - 5.2 mmol/L   Chloride 101 96 - 106 mmol/L   CO2 23 18 - 29 mmol/L   Calcium 9.4 8.7 - 10.2 mg/dL   Total Protein 6.6 6.0 - 8.5 g/dL   Albumin 4.5 3.5 - 5.5 g/dL   Globulin, Total 2.1 1.5 - 4.5 g/dL   Albumin/Globulin Ratio 2.1 1.2 - 2.2   Bilirubin Total 0.3 0.0 - 1.2 mg/dL   Alkaline Phosphatase 81 39 - 117 IU/L   AST 19 0 - 40 IU/L   ALT 14 0 - 32 IU/L  Lipid Panel w/o Chol/HDL Ratio  Result Value Ref Range   Cholesterol, Total 205 (H) 100 - 199 mg/dL   Triglycerides 139 0 - 149 mg/dL   HDL 55 >39 mg/dL   VLDL Cholesterol Cal 28 5 - 40 mg/dL   LDL Calculated 122 (H) 0 -  99 mg/dL  TSH    Result Value Ref Range   TSH 1.180 0.450 - 4.500 uIU/mL      Assessment & Plan:   Problem List Items Addressed This Visit      Unprioritized   Depression, major, single episode, mild ([place])    New problem.  Start Citalopram      Relevant Medications   citalopram (CELEXA) 20 MG tablet   Insomnia    New problem related to hot flashes and depression      Menopause    Menopausal symptoms bothering her.  Start Citalopram      Relevant Orders   MM DIGITAL SCREENING BILATERAL    Other Visit Diagnoses    Routine general medical examination at a health care facility    -  Primary   Relevant Orders   CBC with Differential/Platelet   Comprehensive metabolic panel   Lipid Panel w/o Chol/HDL Ratio   TSH   IGP, Aptima HPV, rfx 16/18,45       Follow up plan: Return in about 4 weeks (around 07/11/2016).

## 2016-06-13 NOTE — Patient Instructions (Addendum)
Please do call to schedule your mammogram; the number to schedule one at either [facility] or [facility] is [phone]   Preventive Care 40-64 Years, Female Preventive care refers to lifestyle choices and visits with your health care provider that can promote health and wellness. What does preventive care include?  A yearly physical exam. This is also called an annual well check.  Dental exams once or twice a year.  Routine eye exams. Ask your health care provider how often you should have your eyes checked.  Personal lifestyle choices, including: ? Daily care of your teeth and gums. ? Regular physical activity. ? Eating a healthy diet. ? Avoiding tobacco and drug use. ? Limiting alcohol use. ? Practicing safe sex. ? Taking low-dose aspirin daily starting at age 50. ? Taking vitamin and mineral supplements as recommended by your health care provider. What happens during an annual well check? The services and screenings done by your health care provider during your annual well check will depend on your age, overall health, lifestyle risk factors, and family history of disease. Counseling Your health care provider may ask you questions about your:  Alcohol use.  Tobacco use.  Drug use.  Emotional well-being.  Home and relationship well-being.  Sexual activity.  Eating habits.  Work and work environment.  Method of birth control.  Menstrual cycle.  Pregnancy history.  Screening You may have the following tests or measurements:  Height, weight, and BMI.  Blood pressure.  Lipid and cholesterol levels. These may be checked every 5 years, or more frequently if you are over 50 years old.  Skin check.  Lung cancer screening. You may have this screening every year starting at age 55 if you have a 30-pack-year history of smoking and currently smoke or have quit within the past 15 years.  Fecal occult blood test (FOBT) of the stool.  You may have this test every year starting at age 50.  Flexible sigmoidoscopy or colonoscopy. You may have a sigmoidoscopy every 5 years or a colonoscopy every 10 years starting at age 50.  Hepatitis C blood test.  Hepatitis B blood test.  Sexually transmitted disease (STD) testing.  Diabetes screening. This is done by checking your blood sugar (glucose) after you have not eaten for a while (fasting). You may have this done every 1-3 years.  Mammogram. This may be done every 1-2 years. Talk to your health care provider about when you should start having regular mammograms. This may depend on whether you have a family history of breast cancer.  BRCA-related cancer screening. This may be done if you have a family history of breast, ovarian, tubal, or peritoneal cancers.  Pelvic exam and Pap test. This may be done every 3 years starting at age 21. Starting at age 30, this may be done every 5 years if you have a Pap test in combination with an HPV test.  Bone density scan. This is done to screen for osteoporosis. You may have this scan if you are at high risk for osteoporosis.  Discuss your test results, treatment options, and if necessary, the need for more tests with your health care provider. Vaccines Your health care provider may recommend certain vaccines, such as:  Influenza vaccine. This is recommended every year.  Tetanus, diphtheria, and acellular pertussis (Tdap, Td) vaccine. You may need a Td booster every 10 years.  Varicella vaccine. You may need this if you have not been vaccinated.  Zoster vaccine. You may   need this after age 60.  Measles, mumps, and rubella (MMR) vaccine. You may need at least one dose of MMR if you were born in 1957 or later. You may also need a second dose.  Pneumococcal 13-valent conjugate (PCV13) vaccine. You may need this if you have certain conditions and were not previously vaccinated.  Pneumococcal polysaccharide (PPSV23) vaccine. You may need  one or two doses if you smoke cigarettes or if you have certain conditions.  Meningococcal vaccine. You may need this if you have certain conditions.  Hepatitis A vaccine. You may need this if you have certain conditions or if you travel or work in places where you may be exposed to hepatitis A.  Hepatitis B vaccine. You may need this if you have certain conditions or if you travel or work in places where you may be exposed to hepatitis B.  Haemophilus influenzae type b (Hib) vaccine. You may need this if you have certain conditions.  Talk to your health care provider about which screenings and vaccines you need and how often you need them. This information is not intended to replace advice given to you by your health care provider. Make sure you discuss any questions you have with your health care provider. Document Released: 03/13/2015 Document Revised: 11/04/2015 Document Reviewed: 12/16/2014 Elsevier Interactive Patient Education  2017 [employer]vier Inc[employer]  

## 2016-06-13 NOTE — Assessment & Plan Note (Signed)
New problem related to hot flashes and depression

## 2016-06-13 NOTE — Assessment & Plan Note (Signed)
Menopausal symptoms bothering her.  Start Citalopram

## 2016-06-14 ENCOUNTER — Encounter: Payer: Self-pay | Admitting: Unknown Physician Specialty

## 2016-06-14 LAB — LIPID PANEL W/O CHOL/HDL RATIO
CHOLESTEROL TOTAL: 218 mg/dL — AB (ref 100–199)
HDL: 48 mg/dL (ref >39)
LDL Calculated: 128 mg/dL — ABNORMAL HIGH (ref 0–99)
Triglycerides: 208 mg/dL — ABNORMAL HIGH (ref 0–149)
VLDL CHOLESTEROL CAL: 42 mg/dL — AB (ref 5–40)

## 2016-06-14 LAB — COMPREHENSIVE METABOLIC PANEL
ALK PHOS: 82 IU/L (ref 39–117)
ALT: 25 IU/L (ref 0–32)
AST: 21 IU/L (ref 0–40)
Albumin/Globulin Ratio: 2.1 (ref 1.2–2.2)
Albumin: 4.5 g/dL (ref 3.5–5.5)
BILIRUBIN TOTAL: 0.3 mg/dL (ref 0.0–1.2)
BUN/Creatinine Ratio: 18 (ref 9–23)
BUN: 14 mg/dL (ref 6–24)
CHLORIDE: 100 mmol/L (ref 96–106)
CO2: 23 mmol/L (ref 18–29)
Calcium: 9.3 mg/dL (ref 8.7–10.2)
Creatinine, Ser: 0.8 mg/dL (ref 0.57–1.00)
GFR calc Af Amer: 96 mL/min/{1.73_m2} (ref >59)
GFR calc non Af Amer: 83 mL/min/{1.73_m2} (ref >59)
GLUCOSE: 77 mg/dL (ref 65–99)
Globulin, Total: 2.1 g/dL (ref 1.5–4.5)
Potassium: 3.7 mmol/L (ref 3.5–5.2)
Sodium: 141 mmol/L (ref 134–144)
Total Protein: 6.6 g/dL (ref 6.0–8.5)

## 2016-06-14 LAB — CBC WITH DIFFERENTIAL/PLATELET
BASOS ABS: 0 10*3/uL (ref 0.0–0.2)
Basos: 0 %
EOS (ABSOLUTE): 0 10*3/uL (ref 0.0–0.4)
Eos: 0 %
Hematocrit: 38.6 % (ref 34.0–46.6)
Hemoglobin: 13.3 g/dL (ref 11.1–15.9)
Immature Grans (Abs): 0 10*3/uL (ref 0.0–0.1)
Immature Granulocytes: 0 %
LYMPHS ABS: 2.6 10*3/uL (ref 0.7–3.1)
LYMPHS: 29 %
MCH: 30.6 pg (ref 26.6–33.0)
MCHC: 34.5 g/dL (ref 31.5–35.7)
MCV: 89 fL (ref 79–97)
Monocytes Absolute: 0.5 10*3/uL (ref 0.1–0.9)
Monocytes: 6 %
NEUTROS ABS: 5.8 10*3/uL (ref 1.4–7.0)
NEUTROS PCT: 65 %
PLATELETS: 261 10*3/uL (ref 150–379)
RBC: 4.35 x10E6/uL (ref 3.77–5.28)
RDW: 13.7 % (ref 12.3–15.4)
WBC: 9 10*3/uL (ref 3.4–10.8)

## 2016-06-14 LAB — TSH: TSH: 1.49 u[IU]/mL (ref 0.450–4.500)

## 2016-06-15 LAB — IGP, APTIMA HPV, RFX 16/18,45
HPV Aptima: NEGATIVE
PAP Smear Comment: 0

## 2016-07-04 ENCOUNTER — Ambulatory Visit
Admission: RE | Admit: 2016-07-04 | Discharge: 2016-07-04 | Disposition: A | Discharge status: Home / Self Care | Payer: Commercial Managed Care - PPO | Source: Ambulatory Visit | Attending: Unknown Physician Specialty | Admitting: Unknown Physician Specialty

## 2016-07-04 DIAGNOSIS — Z1231 Encounter for screening mammogram for malignant neoplasm of breast: Secondary | ICD-10-CM | POA: Insufficient documentation

## 2016-07-04 DIAGNOSIS — Z780 Asymptomatic menopausal state: Secondary | ICD-10-CM

## 2016-07-11 ENCOUNTER — Encounter: Payer: Self-pay | Admitting: Unknown Physician Specialty

## 2016-07-11 ENCOUNTER — Ambulatory Visit (INDEPENDENT_AMBULATORY_CARE_PROVIDER_SITE_OTHER): Payer: Commercial Managed Care - PPO | Admitting: Unknown Physician Specialty

## 2016-07-11 DIAGNOSIS — F320 Major depressive disorder, single episode, mild: Secondary | ICD-10-CM

## 2016-07-11 DIAGNOSIS — F5101 Primary insomnia: Secondary | ICD-10-CM | POA: Diagnosis not present

## 2016-07-11 NOTE — Assessment & Plan Note (Signed)
Improved, continue present medications.

## 2016-07-11 NOTE — Assessment & Plan Note (Signed)
Glycinate or Citrate.   Switch Citalopram to AM Take .5 mg of Melatonin

## 2016-07-11 NOTE — Progress Notes (Signed)
BP 118/76   Pulse 65   Temp 98.3 F (36.8 C)   Wt 182 lb 9.6 oz (82.8 kg)   LMP  (LMP Unknown)   SpO2 97%   BMI 31.25 kg/m    Subjective:    Patient ID: Jeanette Henry, female    DOB: 09-Oct-1960, 56 y.o.   MRN: [id number]  HPI: Jeanette Henry is a 56 y.o. female  Chief Complaint  Patient presents with  . Depression    4 week f/up   Depression Pt states she is doing better.  Still not sleeping well. This is normally on work nights this is a problem.   Depression screen [facility] 2/9 07/11/2016 06/13/2016 10/20/2015 12/09/2014  Decreased Interest 1 2 0 2  Down, Depressed, Hopeless 1 2 0 0  PHQ - 2 Score 2 4 0 2  Altered sleeping - 3 - -  Tired, decreased energy - 1 - -  Change in appetite - 0 - -  Feeling bad or failure about yourself  - 1 - -  Trouble concentrating - 0 - -  Moving slowly or fidgety/restless - 0 - -  Suicidal thoughts - 0 - -  PHQ-9 Score - 9 - -    Relevant past medical, surgical, family and social history reviewed and updated as indicated. Interim medical history since our last visit reviewed. Allergies and medications reviewed and updated.  Review of Systems  Per HPI unless specifically indicated above     Objective:    BP 118/76   Pulse 65   Temp 98.3 F (36.8 C)   Wt 182 lb 9.6 oz (82.8 kg)   LMP  (LMP Unknown)   SpO2 97%   BMI 31.25 kg/m   Wt Readings from Last 3 Encounters:  07/11/16 182 lb 9.6 oz (82.8 kg)  06/13/16 184 lb 14.4 oz (83.9 kg)  10/20/15 185 lb 3.2 oz (84 kg)    Physical Exam  Constitutional: She is oriented to person, place, and time. She appears well-developed and well-nourished. No distress.  HENT:  Head: Normocephalic and atraumatic.  Eyes: Conjunctivae and lids are normal. Right eye exhibits no discharge. Left eye exhibits no discharge. No scleral icterus.  Neck: Normal range of motion. Neck supple. No JVD present. Carotid bruit is not present.  Cardiovascular: Normal rate, regular rhythm and normal heart  sounds.   Pulmonary/Chest: Effort normal and breath sounds normal.  Abdominal: Normal appearance. There is no splenomegaly or hepatomegaly.  Musculoskeletal: Normal range of motion.  Neurological: She is alert and oriented to person, place, and time.  Skin: Skin is warm, dry and intact. No rash noted. No pallor.  Psychiatric: She has a normal mood and affect. Her behavior is normal. Judgment and thought content normal.    Results for orders placed or performed in visit on 06/13/16  CBC with Differential/Platelet  Result Value Ref Range   WBC 9.0 3.4 - 10.8 x10E3/uL   RBC 4.35 3.77 - 5.28 x10E6/uL   Hemoglobin 13.3 11.1 - 15.9 g/dL   Hematocrit 38.6 34.0 - 46.6 %   MCV 89 79 - 97 fL   MCH 30.6 26.6 - 33.0 pg   MCHC 34.5 31.5 - 35.7 g/dL   RDW 13.7 12.3 - 15.4 %   Platelets 261 150 - 379 x10E3/uL   Neutrophils 65 Not Estab. %   Lymphs 29 Not Estab. %   Monocytes 6 Not Estab. %   Eos 0 Not Estab. %   Basos 0 Not  Estab. %   Neutrophils Absolute 5.8 1.4 - 7.0 x10E3/uL   Lymphocytes Absolute 2.6 0.7 - 3.1 x10E3/uL   Monocytes Absolute 0.5 0.1 - 0.9 x10E3/uL   EOS (ABSOLUTE) 0.0 0.0 - 0.4 x10E3/uL   Basophils Absolute 0.0 0.0 - 0.2 x10E3/uL   Immature Granulocytes 0 Not Estab. %   Immature Grans (Abs) 0.0 0.0 - 0.1 x10E3/uL  Comprehensive metabolic panel  Result Value Ref Range   Glucose 77 65 - 99 mg/dL   BUN 14 6 - 24 mg/dL   Creatinine, Ser 0.80 0.57 - 1.00 mg/dL   GFR calc non Af Amer 83 >59 mL/min/1.73   GFR calc Af Amer 96 >59 mL/min/1.73   BUN/Creatinine Ratio 18 9 - 23   Sodium 141 134 - 144 mmol/L   Potassium 3.7 3.5 - 5.2 mmol/L   Chloride 100 96 - 106 mmol/L   CO2 23 18 - 29 mmol/L   Calcium 9.3 8.7 - 10.2 mg/dL   Total Protein 6.6 6.0 - 8.5 g/dL   Albumin 4.5 3.5 - 5.5 g/dL   Globulin, Total 2.1 1.5 - 4.5 g/dL   Albumin/Globulin Ratio 2.1 1.2 - 2.2   Bilirubin Total 0.3 0.0 - 1.2 mg/dL   Alkaline Phosphatase 82 39 - 117 IU/L   AST 21 0 - 40 IU/L   ALT 25 0 -  32 IU/L  Lipid Panel w/o Chol/HDL Ratio  Result Value Ref Range   Cholesterol, Total 218 (H) 100 - 199 mg/dL   Triglycerides 208 (H) 0 - 149 mg/dL   HDL 48 >39 mg/dL   VLDL Cholesterol Cal 42 (H) 5 - 40 mg/dL   LDL Calculated 128 (H) 0 - 99 mg/dL  TSH  Result Value Ref Range   TSH 1.490 0.450 - 4.500 uIU/mL  IGP, Aptima HPV, rfx 16/18,45  Result Value Ref Range   DIAGNOSIS: Comment    Specimen adequacy: Comment    CLINICIAN PROVIDED ICD10: Comment    Performed by: Comment    PAP SMEAR COMMENT .    Note: Comment    Test Methodology Comment    HPV Aptima Negative Negative      Assessment & Plan:   Problem List Items Addressed This Visit      Unprioritized   Depression, major, single episode, mild ([place])    Improved, continue present medications.        Insomnia    Glycinate or Citrate.   Switch Citalopram to AM Take .5 mg of Melatonin           Follow up plan: Return in about 3 months (around 10/11/2016).

## 2016-07-11 NOTE — Patient Instructions (Addendum)
Insomnia Insomnia is a sleep disorder that makes it difficult to fall asleep or to stay asleep. Insomnia can cause tiredness (fatigue), low energy, difficulty concentrating, mood swings, and poor performance at work or school. There are three different ways to classify insomnia:  Difficulty falling asleep.  Difficulty staying asleep.  Waking up too early in the morning. Any type of insomnia can be long-term (chronic) or short-term (acute). Both are common. Short-term insomnia usually lasts for three months or less. Chronic insomnia occurs at least three times a week for longer than three months. What are the causes? Insomnia may be caused by another condition, situation, or substance, such as:  Anxiety.  Certain medicines.  Gastroesophageal reflux disease (GERD) or other gastrointestinal conditions.  Asthma or other breathing conditions.  Restless legs syndrome, sleep apnea, or other sleep disorders.  Chronic pain.  Menopause. This may include hot flashes.  Stroke.  Abuse of alcohol, tobacco, or illegal drugs.  Depression.  Caffeine.  Neurological disorders, such as Alzheimer disease.  An overactive thyroid (hyperthyroidism). The cause of insomnia may not be known. What increases the risk? Risk factors for insomnia include:  Gender. Women are more commonly affected than men.  Age. Insomnia is more common as you get older.  Stress. This may involve your professional or personal life.  Income. Insomnia is more common in people with lower income.  Lack of exercise.  Irregular work schedule or night shifts.  Traveling between different time zones. What are the signs or symptoms? If you have insomnia, trouble falling asleep or trouble staying asleep is the main symptom. This may lead to other symptoms, such as:  Feeling fatigued.  Feeling nervous about going to sleep.  Not feeling rested in the morning.  Having trouble concentrating.  Feeling irritable,  anxious, or depressed. How is this treated? Treatment for insomnia depends on the cause. If your insomnia is caused by an underlying condition, treatment will focus on addressing the condition. Treatment may also include:  Medicines to help you sleep.  Counseling or therapy.  Lifestyle adjustments. Follow these instructions at home:  Take medicines only as directed by your health care provider.  Keep regular sleeping and waking hours. Avoid naps.  Keep a sleep diary to help you and your health care provider figure out what could be causing your insomnia. Include:  When you sleep.  When you wake up during the night.  How well you sleep.  How rested you feel the next day.  Any side effects of medicines you are taking.  What you eat and drink.  Make your bedroom a comfortable place where it is easy to fall asleep:  Put up shades or special blackout curtains to block light from outside.  Use a white noise machine to block noise.  Keep the temperature cool.  Exercise regularly as directed by your health care provider. Avoid exercising right before bedtime.  Use relaxation techniques to manage stress. Ask your health care provider to suggest some techniques that may work well for you. These may include:  Breathing exercises.  Routines to release muscle tension.  Visualizing peaceful scenes.  Cut back on alcohol, caffeinated beverages, and cigarettes, especially close to bedtime. These can disrupt your sleep.  Do not overeat or eat spicy foods right before bedtime. This can lead to digestive discomfort that can make it hard for you to sleep.  Limit screen use before bedtime. This includes:  Watching TV.  Using your smartphone, tablet, and computer.  Stick to a   routine. This can help you fall asleep faster. Try to do a quiet activity, brush your teeth, and go to bed at the same time each night.  Get out of bed if you are still awake after 15 minutes of trying to  sleep. Keep the lights down, but try reading or doing a quiet activity. When you feel sleepy, go back to bed.  Make sure that you drive carefully. Avoid driving if you feel very sleepy.  Keep all follow-up appointments as directed by your health care provider. This is important. Contact a health care provider if:  You are tired throughout the day or have trouble in your daily routine due to sleepiness.  You continue to have sleep problems or your sleep problems get worse. Get help right away if:  You have serious thoughts about hurting yourself or someone else. This information is not intended to replace advice given to you by your health care provider. Make sure you discuss any questions you have with your health care provider. Document Released: 02/12/2000 Document Revised: 07/17/2015 Document Reviewed: 11/15/2013 Elsevier Interactive Patient Education  2017 [employer]  ----------------------------------------------------------------------------- Take Magnesium at night.   Glycinate or Citrate.   Switch Citalopram to AM Take .5 mg of Melatonin

## 2016-09-30 ENCOUNTER — Other Ambulatory Visit: Payer: Self-pay | Admitting: Unknown Physician Specialty

## 2016-10-12 ENCOUNTER — Ambulatory Visit: Payer: Commercial Managed Care - PPO | Admitting: Unknown Physician Specialty

## 2016-10-17 ENCOUNTER — Encounter: Payer: Self-pay | Admitting: Unknown Physician Specialty

## 2016-10-17 ENCOUNTER — Ambulatory Visit (INDEPENDENT_AMBULATORY_CARE_PROVIDER_SITE_OTHER): Payer: Commercial Managed Care - PPO | Admitting: Unknown Physician Specialty

## 2016-10-17 DIAGNOSIS — F320 Major depressive disorder, single episode, mild: Secondary | ICD-10-CM | POA: Diagnosis not present

## 2016-10-17 DIAGNOSIS — F5101 Primary insomnia: Secondary | ICD-10-CM

## 2016-10-17 NOTE — Assessment & Plan Note (Signed)
Doing well on Citalopram.  Taking it in the AM

## 2016-10-17 NOTE — Patient Instructions (Signed)
For sleep: Try an exercise routine Try Magnesium Glycinate at night Take Melatonin about 30 minutes before sleep Don't go to bed unless you are tired Don't do anything in bed besides sleep If you are awake in bed for longer than 20 minutes, get out of bed.

## 2016-10-17 NOTE — Assessment & Plan Note (Signed)
Discussed sleep hygeine plus Magnesium and Melatonin QHS.

## 2016-10-17 NOTE — Progress Notes (Signed)
BP 121/78   Pulse 65   Temp 98 F (36.7 C)   Wt 178 lb 3.2 oz (80.8 kg)   LMP  (LMP Unknown)   SpO2 98%   BMI 30.49 kg/m    Subjective:    Patient ID: Jeanette Henry, female    DOB: June 10, 1960, 56 y.o.   MRN: [id number]  HPI: Jeanette Henry is a 56 y.o. female  Chief Complaint  Patient presents with  . Depression  . 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  States she gets up twice to go to the bathroom.  Finds she wakes up in the middle of the night and looks at the clock.  She has not tried Melatonin, Magnesium or exercise.    Depression Feels depression is doing better Depression screen [facility] 2/9 10/17/2016 07/11/2016 06/13/2016 10/20/2015 12/09/2014  Decreased Interest 1 1 2  0 2  Down, Depressed, Hopeless 1 1 2  0 0  PHQ - 2 Score 2 2 4  0 2  Altered sleeping 2 - 3 - -  Tired, decreased energy 1 - 1 - -  Change in appetite 0 - 0 - -  Feeling bad or failure about yourself  1 - 1 - -  Trouble concentrating 0 - 0 - -  Moving slowly or fidgety/restless 0 - 0 - -  Suicidal thoughts 0 - 0 - -  PHQ-9 Score 6 - 9 - -     Relevant past medical, surgical, family and social history reviewed and updated as indicated. Interim medical history since our last visit reviewed. Allergies and medications reviewed and updated.  Review of Systems  Per HPI unless specifically indicated above     Objective:    BP 121/78   Pulse 65   Temp 98 F (36.7 C)   Wt 178 lb 3.2 oz (80.8 kg)   LMP  (LMP Unknown)   SpO2 98%   BMI 30.49 kg/m   Wt Readings from Last 3 Encounters:  10/17/16 178 lb 3.2 oz (80.8 kg)  07/11/16 182 lb 9.6 oz (82.8 kg)  06/13/16 184 lb 14.4 oz (83.9 kg)    Physical Exam  Constitutional: She is oriented to person, place, and time. She appears well-developed and well-nourished. No distress.  HENT:  Head: Normocephalic and atraumatic.  Eyes: Conjunctivae and lids are normal. Right eye exhibits no discharge. Left eye  exhibits no discharge. No scleral icterus.  Neck: Normal range of motion. Neck supple. No JVD present. Carotid bruit is not present.  Cardiovascular: Normal rate, regular rhythm and normal heart sounds.   Pulmonary/Chest: Effort normal and breath sounds normal.  Abdominal: Normal appearance. There is no splenomegaly or hepatomegaly.  Musculoskeletal: Normal range of motion.  Neurological: She is alert and oriented to person, place, and time.  Skin: Skin is warm, dry and intact. No rash noted. No pallor.  Psychiatric: She has a normal mood and affect. Her behavior is normal. Judgment and thought content normal.    Results for orders placed or performed in visit on 06/13/16  CBC with Differential/Platelet  Result Value Ref Range   WBC 9.0 3.4 - 10.8 x10E3/uL   RBC 4.35 3.77 - 5.28 x10E6/uL   Hemoglobin 13.3 11.1 - 15.9 g/dL   Hematocrit 38.6 34.0 - 46.6 %   MCV 89 79 - 97 fL   MCH 30.6 26.6 - 33.0 pg   MCHC 34.5 31.5 - 35.7 g/dL   RDW 13.7  12.3 - 15.4 %   Platelets 261 150 - 379 x10E3/uL   Neutrophils 65 Not Estab. %   Lymphs 29 Not Estab. %   Monocytes 6 Not Estab. %   Eos 0 Not Estab. %   Basos 0 Not Estab. %   Neutrophils Absolute 5.8 1.4 - 7.0 x10E3/uL   Lymphocytes Absolute 2.6 0.7 - 3.1 x10E3/uL   Monocytes Absolute 0.5 0.1 - 0.9 x10E3/uL   EOS (ABSOLUTE) 0.0 0.0 - 0.4 x10E3/uL   Basophils Absolute 0.0 0.0 - 0.2 x10E3/uL   Immature Granulocytes 0 Not Estab. %   Immature Grans (Abs) 0.0 0.0 - 0.1 x10E3/uL  Comprehensive metabolic panel  Result Value Ref Range   Glucose 77 65 - 99 mg/dL   BUN 14 6 - 24 mg/dL   Creatinine, Ser 0.80 0.57 - 1.00 mg/dL   GFR calc non Af Amer 83 >59 mL/min/1.73   GFR calc Af Amer 96 >59 mL/min/1.73   BUN/Creatinine Ratio 18 9 - 23   Sodium 141 134 - 144 mmol/L   Potassium 3.7 3.5 - 5.2 mmol/L   Chloride 100 96 - 106 mmol/L   CO2 23 18 - 29 mmol/L   Calcium 9.3 8.7 - 10.2 mg/dL   Total Protein 6.6 6.0 - 8.5 g/dL   Albumin 4.5 3.5 - 5.5  g/dL   Globulin, Total 2.1 1.5 - 4.5 g/dL   Albumin/Globulin Ratio 2.1 1.2 - 2.2   Bilirubin Total 0.3 0.0 - 1.2 mg/dL   Alkaline Phosphatase 82 39 - 117 IU/L   AST 21 0 - 40 IU/L   ALT 25 0 - 32 IU/L  Lipid Panel w/o Chol/HDL Ratio  Result Value Ref Range   Cholesterol, Total 218 (H) 100 - 199 mg/dL   Triglycerides 208 (H) 0 - 149 mg/dL   HDL 48 >39 mg/dL   VLDL Cholesterol Cal 42 (H) 5 - 40 mg/dL   LDL Calculated 128 (H) 0 - 99 mg/dL  TSH  Result Value Ref Range   TSH 1.490 0.450 - 4.500 uIU/mL  IGP, Aptima HPV, rfx 16/18,45  Result Value Ref Range   DIAGNOSIS: Comment    Specimen adequacy: Comment    Clinician Provided ICD10 Comment    Performed by: Comment    PAP Smear Comment .    Note: Comment    Test Methodology Comment    HPV Aptima Negative Negative      Assessment & Plan:   Problem List Items Addressed This Visit      Unprioritized   Depression, major, single episode, mild ([place])    Doing well on Citalopram.  Taking it in the AM      Insomnia    Discussed sleep hygeine plus Magnesium and Melatonin QHS.           Pt will come in to see me if she continues weight loss.  Last TSH was OK  Follow up plan: Return in about 6 months (around 04/19/2017).

## 2016-10-21 ENCOUNTER — Encounter: Payer: Commercial Managed Care - PPO | Admitting: Unknown Physician Specialty

## 2017-01-18 ENCOUNTER — Other Ambulatory Visit: Payer: Self-pay | Admitting: Unknown Physician Specialty

## 2017-04-19 ENCOUNTER — Encounter: Payer: Self-pay | Admitting: Unknown Physician Specialty

## 2017-04-19 ENCOUNTER — Ambulatory Visit: Payer: Commercial Managed Care - PPO | Admitting: Unknown Physician Specialty

## 2017-04-19 DIAGNOSIS — F320 Major depressive disorder, single episode, mild: Secondary | ICD-10-CM | POA: Diagnosis not present

## 2017-04-19 NOTE — Progress Notes (Signed)
BP 121/80   Pulse 61   Temp 98.3 F (36.8 C) (Oral)   Ht 5' 3.8" (1.621 m)   Wt 185 lb 9.6 oz (84.2 kg)   LMP  (LMP Unknown)   SpO2 98%   BMI 32.06 kg/m    Subjective:    Patient ID: Jeanette Henry, female    DOB: 1960-05-21, 57 y.o.   MRN: [id number]  HPI: Jeanette Henry is a 57 y.o. female  Chief Complaint  Patient presents with  . Depression   Depression Pt is taking Citalopram 20 mg.. Chief problem has been sleep.  She is sleeping much better.  She would like to come off her medication .  Originally April 2018.   Depression screen [facility] 2/9 04/19/2017 10/17/2016 07/11/2016 06/13/2016 10/20/2015  Decreased Interest 0 1 1 2  0  Down, Depressed, Hopeless 0 1 1 2  0  PHQ - 2 Score 0 2 2 4  0  Altered sleeping 1 2 - 3 -  Tired, decreased energy 0 1 - 1 -  Change in appetite 0 0 - 0 -  Feeling bad or failure about yourself  0 1 - 1 -  Trouble concentrating 0 0 - 0 -  Moving slowly or fidgety/restless 0 0 - 0 -  Suicidal thoughts 0 0 - 0 -  PHQ-9 Score 1 6 - 9 -    Relevant past medical, surgical, family and social history reviewed and updated as indicated. Interim medical history since our last visit reviewed. Allergies and medications reviewed and updated.  Review of Systems  Constitutional: Negative.   HENT: Negative.   Respiratory: Negative.   Cardiovascular: Negative.   Gastrointestinal: Negative.   Genitourinary: Negative.   Psychiatric/Behavioral: Negative.     Per HPI unless specifically indicated above     Objective:    BP 121/80   Pulse 61   Temp 98.3 F (36.8 C) (Oral)   Ht 5' 3.8" (1.621 m)   Wt 185 lb 9.6 oz (84.2 kg)   LMP  (LMP Unknown)   SpO2 98%   BMI 32.06 kg/m   Wt Readings from Last 3 Encounters:  04/19/17 185 lb 9.6 oz (84.2 kg)  10/17/16 178 lb 3.2 oz (80.8 kg)  07/11/16 182 lb 9.6 oz (82.8 kg)    Physical Exam  Constitutional: She is oriented to person, place, and time. She appears well-developed and well-nourished. No  distress.  HENT:  Head: Normocephalic and atraumatic.  Eyes: Conjunctivae and lids are normal. Right eye exhibits no discharge. Left eye exhibits no discharge. No scleral icterus.  Neck: Normal range of motion. Neck supple. No JVD present. Carotid bruit is not present.  Cardiovascular: Normal rate, regular rhythm and normal heart sounds.  Pulmonary/Chest: Effort normal and breath sounds normal.  Abdominal: Normal appearance. There is no splenomegaly or hepatomegaly.  Musculoskeletal: Normal range of motion.  Neurological: She is alert and oriented to person, place, and time.  Skin: Skin is warm, dry and intact. No rash noted. No pallor.  Psychiatric: She has a normal mood and affect. Her behavior is normal. Judgment and thought content normal.    Results for orders placed or performed in visit on 06/13/16  CBC with Differential/Platelet  Result Value Ref Range   WBC 9.0 3.4 - 10.8 x10E3/uL   RBC 4.35 3.77 - 5.28 x10E6/uL   Hemoglobin 13.3 11.1 - 15.9 g/dL   Hematocrit 38.6 34.0 - 46.6 %   MCV 89 79 - 97 fL   MCH 30.6  26.6 - 33.0 pg   MCHC 34.5 31.5 - 35.7 g/dL   RDW 13.7 12.3 - 15.4 %   Platelets 261 150 - 379 x10E3/uL   Neutrophils 65 Not Estab. %   Lymphs 29 Not Estab. %   Monocytes 6 Not Estab. %   Eos 0 Not Estab. %   Basos 0 Not Estab. %   Neutrophils Absolute 5.8 1.4 - 7.0 x10E3/uL   Lymphocytes Absolute 2.6 0.7 - 3.1 x10E3/uL   Monocytes Absolute 0.5 0.1 - 0.9 x10E3/uL   EOS (ABSOLUTE) 0.0 0.0 - 0.4 x10E3/uL   Basophils Absolute 0.0 0.0 - 0.2 x10E3/uL   Immature Granulocytes 0 Not Estab. %   Immature Grans (Abs) 0.0 0.0 - 0.1 x10E3/uL  Comprehensive metabolic panel  Result Value Ref Range   Glucose 77 65 - 99 mg/dL   BUN 14 6 - 24 mg/dL   Creatinine, Ser 0.80 0.57 - 1.00 mg/dL   GFR calc non Af Amer 83 >59 mL/min/1.73   GFR calc Af Amer 96 >59 mL/min/1.73   BUN/Creatinine Ratio 18 9 - 23   Sodium 141 134 - 144 mmol/L   Potassium 3.7 3.5 - 5.2 mmol/L   Chloride  100 96 - 106 mmol/L   CO2 23 18 - 29 mmol/L   Calcium 9.3 8.7 - 10.2 mg/dL   Total Protein 6.6 6.0 - 8.5 g/dL   Albumin 4.5 3.5 - 5.5 g/dL   Globulin, Total 2.1 1.5 - 4.5 g/dL   Albumin/Globulin Ratio 2.1 1.2 - 2.2   Bilirubin Total 0.3 0.0 - 1.2 mg/dL   Alkaline Phosphatase 82 39 - 117 IU/L   AST 21 0 - 40 IU/L   ALT 25 0 - 32 IU/L  Lipid Panel w/o Chol/HDL Ratio  Result Value Ref Range   Cholesterol, Total 218 (H) 100 - 199 mg/dL   Triglycerides 208 (H) 0 - 149 mg/dL   HDL 48 >39 mg/dL   VLDL Cholesterol Cal 42 (H) 5 - 40 mg/dL   LDL Calculated 128 (H) 0 - 99 mg/dL  TSH  Result Value Ref Range   TSH 1.490 0.450 - 4.500 uIU/mL  IGP, Aptima HPV, rfx 16/18,45  Result Value Ref Range   DIAGNOSIS: Comment    Specimen adequacy: Comment    Clinician Provided ICD10 Comment    Performed by: Comment    PAP Smear Comment .    Note: Comment    Test Methodology Comment    HPV Aptima Negative Negative      Assessment & Plan:   Problem List Items Addressed This Visit      Unprioritized   Depression, major, single episode, mild ([place])    Discuss slow taper of 1/2 pill per day and then 1/2 pill every other other day.  Common side-effects of tapering discussed including dizzyness, nausea, fatigue, weakness, and headache.  Written instructions given          Follow up plan: Return in about 3 months (around 07/17/2017) for physical.

## 2017-04-19 NOTE — Patient Instructions (Signed)
Take 1/2 pill/day for 2 weeks Then take 1/2 pill every other day for 2 weeks This is only an example of a taper.

## 2017-04-19 NOTE — Assessment & Plan Note (Addendum)
Discuss slow taper of 1/2 pill per day and then 1/2 pill every other other day.  Common side-effects of tapering discussed including dizzyness, nausea, fatigue, weakness, and headache.  Written instructions given

## 2017-07-21 ENCOUNTER — Encounter: Payer: Self-pay | Admitting: Unknown Physician Specialty

## 2017-07-21 ENCOUNTER — Ambulatory Visit (INDEPENDENT_AMBULATORY_CARE_PROVIDER_SITE_OTHER): Payer: 59 | Admitting: Unknown Physician Specialty

## 2017-07-21 VITALS — BP 129/83 | HR 58 | Temp 98.0°F | Ht 63.87 in | Wt 183.1 lb

## 2017-07-21 DIAGNOSIS — Z1211 Encounter for screening for malignant neoplasm of colon: Secondary | ICD-10-CM

## 2017-07-21 DIAGNOSIS — Z0000 Encounter for general adult medical examination without abnormal findings: Secondary | ICD-10-CM

## 2017-07-21 NOTE — Patient Instructions (Addendum)
Please do call to schedule your mammogram; the number to schedule one at either [facility] or [facility] is [phone] ----------------------------------------------------------------    Preventive Care 40-64 Years, Female Preventive care refers to lifestyle choices and visits with your health care provider that can promote health and wellness. What does preventive care include?  A yearly physical exam. This is also called an annual well check.  Dental exams once or twice a year.  Routine eye exams. Ask your health care provider how often you should have your eyes checked.  Personal lifestyle choices, including: ? Daily care of your teeth and gums. ? Regular physical activity. ? Eating a healthy diet. ? Avoiding tobacco and drug use. ? Limiting alcohol use. ? Practicing safe sex. ? Taking low-dose aspirin daily starting at age 50. ? Taking vitamin and mineral supplements as recommended by your health care provider. What happens during an annual well check? The services and screenings done by your health care provider during your annual well check will depend on your age, overall health, lifestyle risk factors, and family history of disease. Counseling Your health care provider may ask you questions about your:  Alcohol use.  Tobacco use.  Drug use.  Emotional well-being.  Home and relationship well-being.  Sexual activity.  Eating habits.  Work and work environment.  Method of birth control.  Menstrual cycle.  Pregnancy history.  Screening You may have the following tests or measurements:  Height, weight, and BMI.  Blood pressure.  Lipid and cholesterol levels. These may be checked every 5 years, or more frequently if you are over 50 years old.  Skin check.  Lung cancer screening. You may have this screening every year starting at age 55 if you have a 30-pack-year history of smoking and currently smoke or have quit within  the past 15 years.  Fecal occult blood test (FOBT) of the stool. You may have this test every year starting at age 50.  Flexible sigmoidoscopy or colonoscopy. You may have a sigmoidoscopy every 5 years or a colonoscopy every 10 years starting at age 50.  Hepatitis C blood test.  Hepatitis B blood test.  Sexually transmitted disease (STD) testing.  Diabetes screening. This is done by checking your blood sugar (glucose) after you have not eaten for a while (fasting). You may have this done every 1-3 years.  Mammogram. This may be done every 1-2 years. Talk to your health care provider about when you should start having regular mammograms. This may depend on whether you have a family history of breast cancer.  BRCA-related cancer screening. This may be done if you have a family history of breast, ovarian, tubal, or peritoneal cancers.  Pelvic exam and Pap test. This may be done every 3 years starting at age 21. Starting at age 30, this may be done every 5 years if you have a Pap test in combination with an HPV test.  Bone density scan. This is done to screen for osteoporosis. You may have this scan if you are at high risk for osteoporosis.  Discuss your test results, treatment options, and if necessary, the need for more tests with your health care provider. Vaccines Your health care provider may recommend certain vaccines, such as:  Influenza vaccine. This is recommended every year.  Tetanus, diphtheria, and acellular pertussis (Tdap, Td) vaccine. You may need a Td booster every 10 years.  Varicella vaccine. You may need this if you have not been vaccinated.  Zoster vaccine.   You may need this after age 60.  Measles, mumps, and rubella (MMR) vaccine. You may need at least one dose of MMR if you were born in 1957 or later. You may also need a second dose.  Pneumococcal 13-valent conjugate (PCV13) vaccine. You may need this if you have certain conditions and were not previously  vaccinated.  Pneumococcal polysaccharide (PPSV23) vaccine. You may need one or two doses if you smoke cigarettes or if you have certain conditions.  Meningococcal vaccine. You may need this if you have certain conditions.  Hepatitis A vaccine. You may need this if you have certain conditions or if you travel or work in places where you may be exposed to hepatitis A.  Hepatitis B vaccine. You may need this if you have certain conditions or if you travel or work in places where you may be exposed to hepatitis B.  Haemophilus influenzae type b (Hib) vaccine. You may need this if you have certain conditions.  Talk to your health care provider about which screenings and vaccines you need and how often you need them. This information is not intended to replace advice given to you by your health care provider. Make sure you discuss any questions you have with your health care provider. Document Released: 03/13/2015 Document Revised: 11/04/2015 Document Reviewed: 12/16/2014 Elsevier Interactive Patient Education  2018 [employer]  

## 2017-07-21 NOTE — Progress Notes (Signed)
BP 129/83   Pulse (!) 58   Temp 98 F (36.7 C) (Oral)   Ht 5' 3.87" (1.622 m)   Wt 183 lb 1.6 oz (83.1 kg)   LMP  (LMP Unknown)   SpO2 97%   BMI 31.56 kg/m    Subjective:    Patient ID: Jeanette Henry, female    DOB: 20-Feb-1961, 57 y.o.   MRN: [id number]  HPI: Jeanette Henry is a 57 y.o. female  Chief Complaint  Patient presents with  . Annual Exam   LUQ pain come and goes.  No change in symptoms.  Something she just "notices."    Social History   Socioeconomic History  . Marital status: Married    Spouse name: Not on file  . Number of children: Not on file  . Years of education: Not on file  . Highest education level: Not on file  Occupational History  . Not on file  Social Needs  . Financial resource strain: Not on file  . Food insecurity:    Worry: Not on file    Inability: Not on file  . Transportation needs:    Medical: Not on file    Non-medical: Not on file  Tobacco Use  . Smoking status: Never Smoker  . Smokeless tobacco: Never Used  Substance and Sexual Activity  . Alcohol use: No    Alcohol/week: 0.0 oz  . Drug use: No  . Sexual activity: Yes    Birth control/protection: Surgical  Lifestyle  . Physical activity:    Days per week: Not on file    Minutes per session: Not on file  . Stress: Not on file  Relationships  . Social connections:    Talks on phone: Not on file    Gets together: Not on file    Attends religious service: Not on file    Active member of club or organization: Not on file    Attends meetings of clubs or organizations: Not on file    Relationship status: Not on file  . Intimate partner violence:    Fear of current or ex partner: Not on file    Emotionally abused: Not on file    Physically abused: Not on file    Forced sexual activity: Not on file  Other Topics Concern  . Not on file  Social History Narrative  . Not on file   Family History  Problem Relation Age of Onset  . Hypertension Mother   . Cancer  Mother        skin  . Heart disease Father   . Breast cancer Neg Hx    Past Medical History:  Diagnosis Date  . Allergy   . Vitamin D deficiency    Past Surgical History:  Procedure Laterality Date  . TUBAL LIGATION    . WRIST SURGERY Left     Relevant past medical, surgical, family and social history reviewed and updated as indicated. Interim medical history since our last visit reviewed. Allergies and medications reviewed and updated.  Review of Systems  Constitutional: Negative.   HENT: Negative.   Eyes: Negative.   Respiratory: Negative.   Cardiovascular: Negative.   Gastrointestinal: Negative.   Genitourinary: Negative.   Musculoskeletal:       Leg cramps.  Back pain comes and goes at night  Neurological: Negative.   Psychiatric/Behavioral: Negative.     Per HPI unless specifically indicated above     Objective:    BP 129/83  Pulse (!) 58   Temp 98 F (36.7 C) (Oral)   Ht 5' 3.87" (1.622 m)   Wt 183 lb 1.6 oz (83.1 kg)   LMP  (LMP Unknown)   SpO2 97%   BMI 31.56 kg/m   Wt Readings from Last 3 Encounters:  07/21/17 183 lb 1.6 oz (83.1 kg)  04/19/17 185 lb 9.6 oz (84.2 kg)  10/17/16 178 lb 3.2 oz (80.8 kg)    Physical Exam  Constitutional: She is oriented to person, place, and time. She appears well-developed and well-nourished.  HENT:  Head: Normocephalic and atraumatic.  Eyes: Pupils are equal, round, and reactive to light. Right eye exhibits no discharge. Left eye exhibits no discharge. No scleral icterus.  Neck: Normal range of motion. Neck supple. Carotid bruit is not present. No thyromegaly present.  Cardiovascular: Normal rate, regular rhythm and normal heart sounds. Exam reveals no gallop and no friction rub.  No murmur heard. Pulmonary/Chest: Effort normal and breath sounds normal. No respiratory distress. She has no wheezes. She has no rales. No breast tenderness or discharge.  Abdominal: Soft. Bowel sounds are normal. There is no  tenderness. There is no rebound.  Genitourinary: No breast tenderness or discharge.  Musculoskeletal: Normal range of motion.  Lymphadenopathy:    She has no cervical adenopathy.  Neurological: She is alert and oriented to person, place, and time.  Skin: Skin is warm, dry and intact. No rash noted.  Psychiatric: She has a normal mood and affect. Her speech is normal and behavior is normal. Judgment and thought content normal. Cognition and memory are normal.    Results for orders placed or performed in visit on 06/13/16  CBC with Differential/Platelet  Result Value Ref Range   WBC 9.0 3.4 - 10.8 x10E3/uL   RBC 4.35 3.77 - 5.28 x10E6/uL   Hemoglobin 13.3 11.1 - 15.9 g/dL   Hematocrit 38.6 34.0 - 46.6 %   MCV 89 79 - 97 fL   MCH 30.6 26.6 - 33.0 pg   MCHC 34.5 31.5 - 35.7 g/dL   RDW 13.7 12.3 - 15.4 %   Platelets 261 150 - 379 x10E3/uL   Neutrophils 65 Not Estab. %   Lymphs 29 Not Estab. %   Monocytes 6 Not Estab. %   Eos 0 Not Estab. %   Basos 0 Not Estab. %   Neutrophils Absolute 5.8 1.4 - 7.0 x10E3/uL   Lymphocytes Absolute 2.6 0.7 - 3.1 x10E3/uL   Monocytes Absolute 0.5 0.1 - 0.9 x10E3/uL   EOS (ABSOLUTE) 0.0 0.0 - 0.4 x10E3/uL   Basophils Absolute 0.0 0.0 - 0.2 x10E3/uL   Immature Granulocytes 0 Not Estab. %   Immature Grans (Abs) 0.0 0.0 - 0.1 x10E3/uL  Comprehensive metabolic panel  Result Value Ref Range   Glucose 77 65 - 99 mg/dL   BUN 14 6 - 24 mg/dL   Creatinine, Ser 0.80 0.57 - 1.00 mg/dL   GFR calc non Af Amer 83 >59 mL/min/1.73   GFR calc Af Amer 96 >59 mL/min/1.73   BUN/Creatinine Ratio 18 9 - 23   Sodium 141 134 - 144 mmol/L   Potassium 3.7 3.5 - 5.2 mmol/L   Chloride 100 96 - 106 mmol/L   CO2 23 18 - 29 mmol/L   Calcium 9.3 8.7 - 10.2 mg/dL   Total Protein 6.6 6.0 - 8.5 g/dL   Albumin 4.5 3.5 - 5.5 g/dL   Globulin, Total 2.1 1.5 - 4.5 g/dL   Albumin/Globulin Ratio 2.1 1.2 -  2.2   Bilirubin Total 0.3 0.0 - 1.2 mg/dL   Alkaline Phosphatase 82 39 - 117  IU/L   AST 21 0 - 40 IU/L   ALT 25 0 - 32 IU/L  Lipid Panel w/o Chol/HDL Ratio  Result Value Ref Range   Cholesterol, Total 218 (H) 100 - 199 mg/dL   Triglycerides 208 (H) 0 - 149 mg/dL   HDL 48 >39 mg/dL   VLDL Cholesterol Cal 42 (H) 5 - 40 mg/dL   LDL Calculated 128 (H) 0 - 99 mg/dL  TSH  Result Value Ref Range   TSH 1.490 0.450 - 4.500 uIU/mL  IGP, Aptima HPV, rfx 16/18,45  Result Value Ref Range   DIAGNOSIS: Comment    Specimen adequacy: Comment    Clinician Provided ICD10 Comment    Performed by: Comment    PAP Smear Comment .    Note: Comment    Test Methodology Comment    HPV Aptima Negative Negative      Assessment & Plan:   Problem List Items Addressed This Visit    None    Visit Diagnoses    Colon cancer screening    -  Primary   Relevant Orders   Ambulatory referral to Gastroenterology   Annual physical exam       Relevant Orders   CBC with Differential/Platelet   Lipid Panel w/o Chol/HDL Ratio   VITAMIN D 25 Hydroxy (Vit-D Deficiency, Fractures)   TSH       Health Maintenance: Schedule colonoscopy Schedule mammogram  Follow up plan: Return if symptoms worsen or fail to improve.

## 2017-07-22 LAB — CBC WITH DIFFERENTIAL/PLATELET
Basophils Absolute: 0 10*3/uL (ref 0.0–0.2)
Basos: 1 %
EOS (ABSOLUTE): 0.1 10*3/uL (ref 0.0–0.4)
Eos: 1 %
Hematocrit: 41.6 % (ref 34.0–46.6)
Hemoglobin: 13.8 g/dL (ref 11.1–15.9)
IMMATURE GRANULOCYTES: 0 %
Immature Grans (Abs): 0 10*3/uL (ref 0.0–0.1)
LYMPHS ABS: 1.5 10*3/uL (ref 0.7–3.1)
Lymphs: 31 %
MCH: 30.1 pg (ref 26.6–33.0)
MCHC: 33.2 g/dL (ref 31.5–35.7)
MCV: 91 fL (ref 79–97)
MONOS ABS: 0.3 10*3/uL (ref 0.1–0.9)
Monocytes: 6 %
NEUTROS PCT: 61 %
Neutrophils Absolute: 3 10*3/uL (ref 1.4–7.0)
PLATELETS: 235 10*3/uL (ref 150–450)
RBC: 4.59 x10E6/uL (ref 3.77–5.28)
RDW: 13.2 % (ref 12.3–15.4)
WBC: 5 10*3/uL (ref 3.4–10.8)

## 2017-07-22 LAB — LIPID PANEL W/O CHOL/HDL RATIO
Cholesterol, Total: 210 mg/dL — ABNORMAL HIGH (ref 100–199)
HDL: 55 mg/dL (ref >39)
LDL Calculated: 125 mg/dL — ABNORMAL HIGH (ref 0–99)
TRIGLYCERIDES: 150 mg/dL — AB (ref 0–149)
VLDL Cholesterol Cal: 30 mg/dL (ref 5–40)

## 2017-07-22 LAB — TSH: TSH: 1.73 u[IU]/mL (ref 0.450–4.500)

## 2017-07-22 LAB — VITAMIN D 25 HYDROXY (VIT D DEFICIENCY, FRACTURES): VIT D 25 HYDROXY: 21.9 ng/mL — AB (ref 30.0–100.0)

## 2017-07-25 ENCOUNTER — Other Ambulatory Visit: Payer: Self-pay

## 2017-07-25 ENCOUNTER — Encounter: Payer: Self-pay | Admitting: Unknown Physician Specialty

## 2017-07-25 DIAGNOSIS — Z1211 Encounter for screening for malignant neoplasm of colon: Secondary | ICD-10-CM

## 2017-07-27 ENCOUNTER — Other Ambulatory Visit: Payer: Self-pay | Admitting: Unknown Physician Specialty

## 2017-07-27 DIAGNOSIS — Z1231 Encounter for screening mammogram for malignant neoplasm of breast: Secondary | ICD-10-CM

## 2017-08-09 ENCOUNTER — Ambulatory Visit
Admission: RE | Admit: 2017-08-09 | Discharge: 2017-08-09 | Disposition: A | Discharge status: Home / Self Care | Payer: 59 | Source: Ambulatory Visit | Attending: Unknown Physician Specialty | Admitting: Unknown Physician Specialty

## 2017-08-09 DIAGNOSIS — Z1231 Encounter for screening mammogram for malignant neoplasm of breast: Secondary | ICD-10-CM | POA: Diagnosis present

## 2017-08-11 ENCOUNTER — Ambulatory Visit: Payer: 59 | Admitting: Anesthesiology

## 2017-08-11 ENCOUNTER — Encounter: Payer: Self-pay | Admitting: *Deleted

## 2017-08-11 ENCOUNTER — Ambulatory Visit
Admission: RE | Admit: 2017-08-11 | Discharge: 2017-08-11 | Disposition: A | Discharge status: Home / Self Care | Payer: 59 | Source: Ambulatory Visit | Attending: Gastroenterology | Admitting: Gastroenterology

## 2017-08-11 ENCOUNTER — Encounter
Admission: RE | Disposition: A | Discharge status: Home / Self Care | Payer: Self-pay | Source: Ambulatory Visit | Attending: Gastroenterology

## 2017-08-11 DIAGNOSIS — Z79899 Other long term (current) drug therapy: Secondary | ICD-10-CM | POA: Diagnosis not present

## 2017-08-11 DIAGNOSIS — E559 Vitamin D deficiency, unspecified: Secondary | ICD-10-CM | POA: Diagnosis not present

## 2017-08-11 DIAGNOSIS — K648 Other hemorrhoids: Secondary | ICD-10-CM

## 2017-08-11 DIAGNOSIS — Z1211 Encounter for screening for malignant neoplasm of colon: Secondary | ICD-10-CM

## 2017-08-11 DIAGNOSIS — D122 Benign neoplasm of ascending colon: Secondary | ICD-10-CM

## 2017-08-11 DIAGNOSIS — Z8249 Family history of ischemic heart disease and other diseases of the circulatory system: Secondary | ICD-10-CM | POA: Diagnosis not present

## 2017-08-11 DIAGNOSIS — K644 Residual hemorrhoidal skin tags: Secondary | ICD-10-CM

## 2017-08-11 DIAGNOSIS — Z808 Family history of malignant neoplasm of other organs or systems: Secondary | ICD-10-CM | POA: Insufficient documentation

## 2017-08-11 HISTORY — PX: COLONOSCOPY WITH PROPOFOL: SHX5780

## 2017-08-11 SURGERY — COLONOSCOPY WITH PROPOFOL
Anesthesia: General

## 2017-08-11 MED ORDER — PROPOFOL 10 MG/ML IV BOLUS
INTRAVENOUS | Status: DC | PRN
  Administered 2017-08-11: 20 mg via INTRAVENOUS
  Administered 2017-08-11: 50 mg via INTRAVENOUS

## 2017-08-11 MED ORDER — PROPOFOL 500 MG/50ML IV EMUL
INTRAVENOUS | Status: DC | PRN
  Administered 2017-08-11: 140 ug/kg/min via INTRAVENOUS

## 2017-08-11 MED ORDER — SODIUM CHLORIDE 0.9 % IV SOLN
INTRAVENOUS | Status: DC
  Administered 2017-08-11: 1000 mL via INTRAVENOUS
  Administered 2017-08-11: 08:00:00 via INTRAVENOUS

## 2017-08-11 MED ORDER — MIDAZOLAM HCL 2 MG/2ML IJ SOLN
INTRAMUSCULAR | Status: AC
  Filled 2017-08-11: qty 2

## 2017-08-11 MED ORDER — MIDAZOLAM HCL 2 MG/2ML IJ SOLN
INTRAMUSCULAR | Status: DC | PRN
  Administered 2017-08-11 (×2): 1 mg via INTRAVENOUS

## 2017-08-11 MED ORDER — FENTANYL CITRATE (PF) 100 MCG/2ML IJ SOLN
INTRAMUSCULAR | Status: DC | PRN
  Administered 2017-08-11: 50 ug via INTRAVENOUS

## 2017-08-11 MED ORDER — EPHEDRINE SULFATE 50 MG/ML IJ SOLN
INTRAMUSCULAR | Status: DC | PRN
  Administered 2017-08-11 (×2): 10 mg via INTRAVENOUS

## 2017-08-11 MED ORDER — FENTANYL CITRATE (PF) 100 MCG/2ML IJ SOLN
INTRAMUSCULAR | Status: AC
  Filled 2017-08-11: qty 2

## 2017-08-11 MED ORDER — PROPOFOL 500 MG/50ML IV EMUL
INTRAVENOUS | Status: AC
  Filled 2017-08-11: qty 50

## 2017-08-11 NOTE — Anesthesia Preprocedure Evaluation (Signed)
Anesthesia Evaluation  Patient identified by MRN, date of birth, ID band Patient awake    Reviewed: Allergy & Precautions, H&P , NPO status , Patient's Chart, lab work & pertinent test results, reviewed documented beta blocker date and time   Airway Mallampati: II   Neck ROM: full    Dental  (+) Teeth Intact   Pulmonary neg pulmonary ROS,    Pulmonary exam normal        Cardiovascular negative cardio ROS Normal cardiovascular exam Rhythm:regular Rate:Normal     Neuro/Psych negative neurological ROS  negative psych ROS   GI/Hepatic negative GI ROS, Neg liver ROS,   Endo/Other  negative endocrine ROS  Renal/GU negative Renal ROS  negative genitourinary   Musculoskeletal   Abdominal   Peds  Hematology negative hematology ROS (+)   Anesthesia Other Findings Past Medical History: No date: Allergy No date: Vitamin D deficiency Past Surgical History: No date: TUBAL LIGATION No date: WRIST SURGERY; Left BMI    Body Mass Index:  31.24 kg/m     Reproductive/Obstetrics negative OB ROS                             Anesthesia Physical Anesthesia Plan  ASA: II  Anesthesia Plan: General   Post-op Pain Management:    Induction:   PONV Risk Score and Plan:   Airway Management Planned:   Additional Equipment:   Intra-op Plan:   Post-operative Plan:   Informed Consent: I have reviewed the patients History and Physical, chart, labs and discussed the procedure including the risks, benefits and alternatives for the proposed anesthesia with the patient or authorized representative who has indicated his/her understanding and acceptance.   Dental Advisory Given  Plan Discussed with: CRNA  Anesthesia Plan Comments:         Anesthesia Quick Evaluation

## 2017-08-11 NOTE — Transfer of Care (Signed)
Immediate Anesthesia Transfer of Care Note  Patient: [name]  Procedure(s) Performed: COLONOSCOPY WITH PROPOFOL (N/A )  Patient Location: PACU  Anesthesia Type:General  Level of Consciousness: awake  Airway & Oxygen Therapy: Patient Spontanous Breathing and Patient connected to nasal cannula oxygen  Post-op Assessment: Report given to RN and Post -op Vital signs reviewed and stable  Post vital signs: Reviewed and stable  Last Vitals:  Vitals Value Taken Time  BP 90/45 08/11/2017  8:47 AM  Temp    Pulse 79 08/11/2017  8:48 AM  Resp 16 08/11/2017  8:48 AM  SpO2 100 % 08/11/2017  8:48 AM  Vitals shown include unvalidated device data.  Last Pain:  Vitals:   08/11/17 0707  TempSrc: Tympanic  PainSc: 0-No pain         Complications: No apparent anesthesia complications

## 2017-08-11 NOTE — Anesthesia Post-op Follow-up Note (Signed)
Anesthesia QCDR form completed.        

## 2017-08-11 NOTE — H&P (Signed)
Vonda Antigua, MD [address], [place], [place], [place], [address] [address], [place], [place], [place], [address] Phone: [phone]  Fax: [phone]  Primary Care Physician:  Kathrine Haddock, NP   Pre-Procedure History & Physical: HPI:  Jeanette Henry is a 57 y.o. female is here for a colonoscopy.   Past Medical History:  Diagnosis Date  . Allergy   . Vitamin D deficiency     Past Surgical History:  Procedure Laterality Date  . TUBAL LIGATION    . WRIST SURGERY Left     Prior to Admission medications   Medication Sig Start Date End Date Taking? Authorizing Provider  fexofenadine (ALLEGRA) 180 MG tablet Take 1 tablet (180 mg total) by mouth daily. 10/20/15   Kathrine Haddock, NP  fluticasone (FLONASE) 50 MCG/ACT nasal spray Place 2 sprays into both nostrils daily. 10/20/15   Kathrine Haddock, NP  Naproxen Sodium (ALEVE PO) Take by mouth as needed.    [provider]    Allergies as of 07/25/2017  . (No Known Allergies)    Family History  Problem Relation Age of Onset  . Hypertension Mother   . Cancer Mother        skin  . Heart disease Father   . Breast cancer Neg Hx     Social History   Socioeconomic History  . Marital status: Married    Spouse name: Not on file  . Number of children: Not on file  . Years of education: Not on file  . Highest education level: Not on file  Occupational History  . Not on file  Social Needs  . Financial resource strain: Not on file  . Food insecurity:    Worry: Not on file    Inability: Not on file  . Transportation needs:    Medical: Not on file    Non-medical: Not on file  Tobacco Use  . Smoking status: Never Smoker  . Smokeless tobacco: Never Used  Substance and Sexual Activity  . Alcohol use: No    Alcohol/week: 0.0 oz  . Drug use: No  . Sexual activity: Yes    Birth control/protection: Surgical  Lifestyle  . Physical activity:    Days per week: Not on file    Minutes per session: Not  on file  . Stress: Not on file  Relationships  . Social connections:    Talks on phone: Not on file    Gets together: Not on file    Attends religious service: Not on file    Active member of club or organization: Not on file    Attends meetings of clubs or organizations: Not on file    Relationship status: Not on file  . Intimate partner violence:    Fear of current or ex partner: Not on file    Emotionally abused: Not on file    Physically abused: Not on file    Forced sexual activity: Not on file  Other Topics Concern  . Not on file  Social History Narrative  . Not on file    Review of Systems: See HPI, otherwise negative ROS  Physical Exam: BP 139/76   Pulse 61   Temp (!) 96.9 F (36.1 C) (Tympanic)   Resp 16   Ht 5\' 4"  (1.626 m)   Wt 182 lb (82.6 kg)   LMP  (LMP Unknown)   SpO2 100%   BMI 31.24 kg/m  General:   Alert,  pleasant and cooperative in NAD Head:  Normocephalic and atraumatic. Neck:  Supple; no masses or thyromegaly. Lungs:  Clear throughout to auscultation, normal respiratory effort.    Heart:  +S1, +S2, Regular rate and rhythm, No edema. Abdomen:  Soft, nontender and nondistended. Normal bowel sounds, without guarding, and without rebound.   Neurologic:  Alert and  oriented x4;  grossly normal neurologically.  Impression/Plan: Jeanette Henry is here for a colonoscopy to be performed for average risk screening.  Risks, benefits, limitations, and alternatives regarding  colonoscopy have been reviewed with the patient.  Questions have been answered.  All parties agreeable.   Virgel Manifold, MD  08/11/2017, 8:03 AM

## 2017-08-11 NOTE — Op Note (Signed)
[facility] Patient Name: Nashayla Telleria Procedure Date: 08/11/2017 8:07 AM MRN: [id number] Account #: [id number] Date of Birth: [birth year] Admit Type: Outpatient Age: 57 Room: [facility] ENDO ROOM 2 Gender: Female Note Status: Finalized Procedure:            Colonoscopy Indications:          Screening for colorectal malignant neoplasm Providers:            Andrienne Havener B. Bonna Gains MD, MD Medicines:            Monitored Anesthesia Care Complications:        No immediate complications. Procedure:            Pre-Anesthesia Assessment:                       - ASA Grade Assessment: II - A patient with mild                        systemic disease.                       - Prior to the procedure, a History and Physical was                        performed, and patient medications, allergies and                        sensitivities were reviewed. The patient's tolerance of                        previous anesthesia was reviewed.                       - The risks and benefits of the procedure and the                        sedation options and risks were discussed with the                        patient. All questions were answered and informed                        consent was obtained.                       - Patient identification and proposed procedure were                        verified prior to the procedure by the physician, the                        nurse, the anesthesiologist, the anesthetist and the                        technician. The procedure was verified in the procedure                        room.                       After obtaining informed consent, the colonoscope was  passed under direct vision. Throughout the procedure,                        the patient's blood pressure, pulse, and oxygen                        saturations were monitored continuously. The                        Colonoscope was introduced through the  anus and                        advanced to the the cecum, identified by appendiceal                        orifice and ileocecal valve. The colonoscopy was                        performed with ease. The patient tolerated the                        procedure well. The quality of the bowel preparation                        was good. Findings:      The perianal exam findings include non-thrombosed external hemorrhoids.      A 3 mm polyp was found in the ascending colon. The polyp was sessile.       The polyp was removed with a cold biopsy forceps. Resection and       retrieval were complete.      The exam was otherwise without abnormality.      The rectum, sigmoid colon, descending colon, transverse colon, ascending       colon and cecum appeared normal.      Non-bleeding internal hemorrhoids were found during retroflexion. Impression:           - Non-thrombosed external hemorrhoids found on perianal                        exam.                       - One 3 mm polyp in the ascending colon, removed with a                        cold biopsy forceps. Resected and retrieved.                       - The examination was otherwise normal.                       - The rectum, sigmoid colon, descending colon,                        transverse colon, ascending colon and cecum are normal.                       - Non-bleeding internal hemorrhoids. Recommendation:       - Discharge patient to home (with escort).                       -  Advance diet as tolerated.                       - Continue present medications.                       - Await pathology results.                       - The findings and recommendations were discussed with                        the patient.                       - The findings and recommendations were discussed with                        the patient's family.                       - Return to primary care physician as previously                         scheduled.                       - High fiber diet.                       - If the pathology report reveals adenomatous tissue,                        then repeat the colonoscopy for surveillance in 5 years.                       - If the pathology report indicates hyperplastic polyp,                        then repeat colonoscopy for screening purposes in 10                        years. Procedure Code(s):    --- Professional ---                       [phone], Colonoscopy, flexible; with biopsy, single or                        multiple Diagnosis Code(s):    --- Professional ---                       Z12.11, Encounter for screening for malignant neoplasm                        of colon                       D12.2, Benign neoplasm of ascending colon                       K64.4, Residual hemorrhoidal skin tags                       K64.8, Other hemorrhoids CPT copyright  2017 American [employer]. All rights reserved. The codes documented in this report are preliminary and upon coder review may  be revised to meet current compliance requirements.  Vonda Antigua, MD Margretta Sidle B. Bonna Gains MD, MD 08/11/2017 8:45:34 AM This report has been signed electronically. Number of Addenda: 0 Note Initiated On: 08/11/2017 8:07 AM Scope Withdrawal Time: 0 hours 18 minutes 31 seconds  Total Procedure Duration: 0 hours 28 minutes 11 seconds  Estimated Blood Loss: Estimated blood loss: none.      [facility]

## 2017-08-11 NOTE — Anesthesia Procedure Notes (Signed)
Date/Time: 08/11/2017 8:10 AM Performed by: Allean Found, CRNA Pre-anesthesia Checklist: Patient identified, Emergency Drugs available, Suction available, Patient being monitored and Timeout performed Oxygen Delivery Method: Nasal cannula Placement Confirmation: positive ETCO2

## 2017-08-13 NOTE — Anesthesia Postprocedure Evaluation (Signed)
Anesthesia Post Note  Patient: Jeanette Henry  Procedure(s) Performed: COLONOSCOPY WITH PROPOFOL (N/A )  Patient location during evaluation: PACU Anesthesia Type: General Level of consciousness: awake and alert Pain management: pain level controlled Vital Signs Assessment: post-procedure vital signs reviewed and stable Respiratory status: spontaneous breathing, nonlabored ventilation, respiratory function stable and patient connected to nasal cannula oxygen Cardiovascular status: blood pressure returned to baseline and stable Postop Assessment: no apparent nausea or vomiting Anesthetic complications: no     Last Vitals:  Vitals:   08/11/17 0917 08/11/17 0927  BP: (!) 114/56 127/75  Pulse: 66 65  Resp: 11 16  Temp:    SpO2: 100% 100%    Last Pain:  Vitals:   08/11/17 0927  TempSrc:   PainSc: 0-No pain                 Molli Barrows

## 2017-08-14 LAB — SURGICAL PATHOLOGY

## 2017-08-15 ENCOUNTER — Encounter: Payer: Self-pay | Admitting: Gastroenterology

## 2017-08-16 ENCOUNTER — Encounter: Payer: Self-pay | Admitting: Gastroenterology

## 2018-04-05 ENCOUNTER — Ambulatory Visit: Admission: RE | Admit: 2018-04-05 | Payer: 59 | Source: Ambulatory Visit

## 2018-04-05 ENCOUNTER — Ambulatory Visit
Admission: RE | Admit: 2018-04-05 | Discharge: 2018-04-05 | Disposition: A | Discharge status: Home / Self Care | Payer: 59 | Source: Ambulatory Visit | Attending: Family Medicine | Admitting: Family Medicine

## 2018-04-05 ENCOUNTER — Ambulatory Visit: Payer: 59 | Admitting: Family Medicine

## 2018-04-05 ENCOUNTER — Other Ambulatory Visit: Payer: Self-pay

## 2018-04-05 ENCOUNTER — Encounter: Payer: Self-pay | Admitting: Family Medicine

## 2018-04-05 VITALS — BP 132/86 | HR 79 | Temp 98.5°F | Ht 63.8 in | Wt 197.0 lb

## 2018-04-05 DIAGNOSIS — M545 Low back pain, unspecified: Secondary | ICD-10-CM

## 2018-04-05 DIAGNOSIS — M25512 Pain in left shoulder: Secondary | ICD-10-CM

## 2018-04-05 DIAGNOSIS — G8929 Other chronic pain: Secondary | ICD-10-CM | POA: Insufficient documentation

## 2018-04-05 MED ORDER — MELOXICAM 15 MG PO TABS: 15.0000 mg | ORAL_TABLET | Freq: Every day | ORAL | 0 refills | Status: DC

## 2018-04-05 NOTE — Progress Notes (Signed)
BP 132/86   Pulse 79   Temp 98.5 F (36.9 C) (Oral)   Ht 5' 3.8" (1.621 m)   Wt 197 lb (89.4 kg)   LMP  (LMP Unknown)   SpO2 97%   BMI 34.03 kg/m    Subjective:    Patient ID: Jeanette Henry, female    DOB: 03/26/1960, 58 y.o.   MRN: [id number]  HPI: Jeanette Henry is a 58 y.o. female  Chief Complaint  Patient presents with  . Shoulder Pain    left side off and on for about 6 months  . Back Pain    lower left side   Dull, achy left shoulder pain worsening for about 6 months now. Worse with movement, becomes sharp. 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 Trying lidocaine patches with mild relief. Tried aleve today with minimal relief.   Left lower back has a dull ache constantly, even during the night when sleeping. This has been ongoing for years. Worse with movement. No radiation down legs, numbness or tingling, weakness.   Relevant past medical, surgical, family and social history reviewed and updated as indicated. Interim medical history since our last visit reviewed. Allergies and medications reviewed and updated.  Review of Systems  Per HPI unless specifically indicated above     Objective:    BP 132/86   Pulse 79   Temp 98.5 F (36.9 C) (Oral)   Ht 5' 3.8" (1.621 m)   Wt 197 lb (89.4 kg)   LMP  (LMP Unknown)   SpO2 97%   BMI 34.03 kg/m   Wt Readings from Last 3 Encounters:  04/05/18 197 lb (89.4 kg)  08/11/17 182 lb (82.6 kg)  07/21/17 183 lb 1.6 oz (83.1 kg)    Physical Exam Vitals signs and nursing note reviewed.  Constitutional:      Appearance: Normal appearance. She is not ill-appearing.  HENT:     Head: Atraumatic.  Eyes:     Extraocular Movements: Extraocular movements intact.     Conjunctiva/sclera: Conjunctivae normal.  Neck:     Musculoskeletal: Normal range of motion and neck supple.  Cardiovascular:     Rate and Rhythm: Normal rate and regular rhythm.     Heart  sounds: Normal heart sounds.  Pulmonary:     Effort: Pulmonary effort is normal.     Breath sounds: Normal breath sounds.  Musculoskeletal: Normal range of motion.        General: Tenderness (diffuse left shoulder ttp, crepitus with PROM) present. No swelling or deformity.     Comments: No midline spinal ttp Paraspinal muscles ttp b/l in lumbar region  Skin:    General: Skin is warm and dry.  Neurological:     Mental Status: She is alert and oriented to person, place, and time.  Psychiatric:        Mood and Affect: Mood normal.        Thought Content: Thought content normal.        Judgment: Judgment normal.     Results for orders placed or performed during the hospital encounter of 08/11/17  Surgical pathology  Result Value Ref Range   SURGICAL PATHOLOGY      Surgical Pathology CASE: [id number] PATIENT: Janda Redler Surgical Pathology Report     SPECIMEN SUBMITTED: A. Colon polyp, ascending; cbx  CLINICAL HISTORY: None provided  PRE-OPERATIVE DIAGNOSIS: Screening colonoscopy  POST-OPERATIVE DIAGNOSIS: External hemorrhoids, internal hemorrhoids,  polyp     DIAGNOSIS: A.  COLON POLYP, ASCENDING; COLD BIOPSY: - TUBULAR ADENOMA. - NEGATIVE FOR HIGH-GRADE DYSPLASIA AND MALIGNANCY.   GROSS DESCRIPTION: A. Labeled: Cbx polyp ascending colon Received: In formalin Tissue fragment(s): 1 Size: 0.3 cm Description: Tan fragment Entirely submitted in one cassette.   Final Diagnosis performed by Bryan Lemma, MD.   Electronically signed 08/14/2017 8:41:44PM The electronic signature indicates that the named Attending Pathologist has evaluated the specimen  Technical component performed at [facility], [address] [place], [place] [address] Lab: [phone] Dir: Rush Farmer, MD, MMM  Professional component performe d at [facility], [facility], [place], [place],  [address] Lab: [phone] Dir: Dellia Nims. Rubinas, MD         Assessment & Plan:   Problem List Items Addressed This Visit    None    Visit Diagnoses    Chronic left shoulder pain    -  Primary   Will obtain shoulder x-ray and start meloxicam, heat, stretches   Relevant Medications   meloxicam (MOBIC) 15 MG tablet   Other Relevant Orders   DG Shoulder Left (Completed)   Chronic left-sided low back pain without sciatica       Await lumbar x-ray, discussed arthritis vs mucle tension. May consider PT if meloxicam, heat do not work   Relevant Medications   meloxicam (MOBIC) 15 MG tablet   Other Relevant Orders   DG Lumbar Spine Complete (Completed)       Follow up plan: Return if symptoms worsen or fail to improve.

## 2018-07-26 ENCOUNTER — Ambulatory Visit (INDEPENDENT_AMBULATORY_CARE_PROVIDER_SITE_OTHER): Payer: Commercial Managed Care - PPO | Admitting: Family Medicine

## 2018-07-26 ENCOUNTER — Encounter: Payer: Self-pay | Admitting: Family Medicine

## 2018-07-26 ENCOUNTER — Other Ambulatory Visit: Payer: Self-pay

## 2018-07-26 VITALS — BP 123/78 | HR 69 | Temp 98.1°F | Ht 63.5 in | Wt 201.0 lb

## 2018-07-26 DIAGNOSIS — Z0000 Encounter for general adult medical examination without abnormal findings: Secondary | ICD-10-CM | POA: Diagnosis not present

## 2018-07-26 DIAGNOSIS — J301 Allergic rhinitis due to pollen: Secondary | ICD-10-CM

## 2018-07-26 MED ORDER — FLUTICASONE PROPIONATE 50 MCG/ACT NA SUSP: 1.0000 | Freq: Two times a day (BID) | NASAL | 6 refills | Status: AC

## 2018-07-26 NOTE — Assessment & Plan Note (Signed)
Will restart flonase BID, continue claritin. Sudafed prn. Suspect post nasal drip causing her continued sxs. F/u if worsening or not improving on this regimen

## 2018-07-26 NOTE — Progress Notes (Signed)
BP 123/78   Pulse 69   Temp 98.1 F (36.7 C) (Oral)   Ht 5' 3.5" (1.613 m)   Wt 201 lb (91.2 kg)   LMP  (LMP Unknown)   SpO2 98%   BMI 35.05 kg/m    Subjective:    Patient ID: Jeanette Henry, female    DOB: [birth year], 58 y.o.   MRN: [id number]  HPI: Jeanette Henry is a 58 y.o. female presenting on 07/26/2018 for comprehensive medical examination. Current medical complaints include:post nasal drip and a cough that feels like clearing her throat the past month or so. Hx of allergies, currently on claritin but has not used flonase in quite some time. No sick contacts, recent travel, fevers, chills, body aches, CP, SOB, wheezing.   She currently lives with: Menopausal Symptoms: no  Depression Screen done today and results listed below:  Depression screen [facility] 2/9 07/21/2017 04/19/2017 10/17/2016 07/11/2016 06/13/2016  Decreased Interest 0 0 1 1 2   Down, Depressed, Hopeless 0 0 1 1 2   PHQ - 2 Score 0 0 2 2 4   Altered sleeping 0 1 2 - 3  Tired, decreased energy 1 0 1 - 1  Change in appetite 0 0 0 - 0  Feeling bad or failure about yourself  0 0 1 - 1  Trouble concentrating 0 0 0 - 0  Moving slowly or fidgety/restless 0 0 0 - 0  Suicidal thoughts 0 0 0 - 0  PHQ-9 Score 1 1 6  - 9    The patient does not have a history of falls. I did complete a risk assessment for falls. A plan of care for falls was documented.   Past Medical History:  Past Medical History:  Diagnosis Date  . Allergy   . Vitamin D deficiency     Surgical History:  Past Surgical History:  Procedure Laterality Date  . COLONOSCOPY WITH PROPOFOL N/A 08/11/2017   Procedure: COLONOSCOPY WITH PROPOFOL;  Surgeon: Virgel Manifold, MD;  Location: [facility] ENDOSCOPY;  Service: Gastroenterology;  Laterality: N/A;  . TUBAL LIGATION    . WRIST SURGERY Left     Medications:  Current Outpatient Medications on File Prior to Visit  Medication Sig  . Acetaminophen (TYLENOL PO) Take by mouth daily.  . Loratadine  (CLARITIN PO) Take by mouth daily.   No current facility-administered medications on file prior to visit.     Allergies:  No Known Allergies  Social History:  Social History   Socioeconomic History  . Marital status: Married    Spouse name: Not on file  . Number of children: Not on file  . Years of education: Not on file  . Highest education level: Not on file  Occupational History  . Not on file  Social Needs  . Financial resource strain: Not on file  . Food insecurity:    Worry: Not on file    Inability: Not on file  . Transportation needs:    Medical: Not on file    Non-medical: Not on file  Tobacco Use  . Smoking status: Never Smoker  . Smokeless tobacco: Never Used  Substance and Sexual Activity  . Alcohol use: No    Alcohol/week: 0.0 standard drinks  . Drug use: No  . Sexual activity: Yes    Birth control/protection: Surgical  Lifestyle  . Physical activity:    Days per week: Not on file    Minutes per session: Not on file  . Stress: Not on file  Relationships  . Social connections:    Talks on phone: Not on file    Gets together: Not on file    Attends religious service: Not on file    Active member of club or organization: Not on file    Attends meetings of clubs or organizations: Not on file    Relationship status: Not on file  . Intimate partner violence:    Fear of current or ex partner: Not on file    Emotionally abused: Not on file    Physically abused: Not on file    Forced sexual activity: Not on file  Other Topics Concern  . Not on file  Social History Narrative  . Not on file   Social History   Tobacco Use  Smoking Status Never Smoker  Smokeless Tobacco Never Used   Social History   Substance and Sexual Activity  Alcohol Use No  . Alcohol/week: 0.0 standard drinks    Family History:  Family History  Problem Relation Age of Onset  . Hypertension Mother   . Cancer Mother        skin  . Heart disease Father   . Breast cancer  Neg Hx     Past medical history, surgical history, medications, allergies, family history and social history reviewed with patient today and changes made to appropriate areas of the chart.   Review of Systems - General ROS: negative Psychological ROS: negative Ophthalmic ROS: negative ENT ROS: positive for - nasal discharge Allergy and Immunology ROS: positive for - postnasal drip and seasonal allergies Hematological and Lymphatic ROS: negative Endocrine ROS: negative Breast ROS: negative for breast lumps Respiratory ROS: positive for - cough Cardiovascular ROS: no chest pain or dyspnea on exertion Gastrointestinal ROS: no abdominal pain, change in bowel habits, or black or bloody stools Genito-Urinary ROS: no dysuria, trouble voiding, or hematuria Musculoskeletal ROS: negative Neurological ROS: no TIA or stroke symptoms Dermatological ROS: negative All other ROS negative except what is listed above and in the HPI.      Objective:    BP 123/78   Pulse 69   Temp 98.1 F (36.7 C) (Oral)   Ht 5' 3.5" (1.613 m)   Wt 201 lb (91.2 kg)   LMP  (LMP Unknown)   SpO2 98%   BMI 35.05 kg/m   Wt Readings from Last 3 Encounters:  07/26/18 201 lb (91.2 kg)  04/05/18 197 lb (89.4 kg)  08/11/17 182 lb (82.6 kg)    Physical Exam Vitals signs and nursing note reviewed.  Constitutional:      General: She is not in acute distress.    Appearance: She is well-developed.  HENT:     Head: Atraumatic.     Right Ear: External ear normal.     Left Ear: External ear normal.     Nose: Nose normal.     Mouth/Throat:     Pharynx: No oropharyngeal exudate.  Eyes:     General: No scleral icterus.    Conjunctiva/sclera: Conjunctivae normal.     Pupils: Pupils are equal, round, and reactive to light.  Neck:     Musculoskeletal: Normal range of motion and neck supple.     Thyroid: No thyromegaly.  Cardiovascular:     Rate and Rhythm: Normal rate and regular rhythm.     Heart sounds: Normal  heart sounds.  Pulmonary:     Effort: Pulmonary effort is normal. No respiratory distress.     Breath sounds: Normal breath sounds.  Chest:  Breasts:        Right: No mass, skin change or tenderness.        Left: No mass, skin change or tenderness.  Abdominal:     General: Bowel sounds are normal.     Palpations: Abdomen is soft. There is no mass.     Tenderness: There is no abdominal tenderness.  Genitourinary:    Comments: Exam deferred with shared decision making  Musculoskeletal: Normal range of motion.        General: No tenderness.  Lymphadenopathy:     Cervical: No cervical adenopathy.     Upper Body:     Right upper body: No axillary adenopathy.     Left upper body: No axillary adenopathy.  Skin:    General: Skin is warm and dry.     Findings: No rash.  Neurological:     Mental Status: She is alert and oriented to person, place, and time.     Cranial Nerves: No cranial nerve deficit.  Psychiatric:        Behavior: Behavior normal.     Results for orders placed or performed during the hospital encounter of 08/11/17  Surgical pathology  Result Value Ref Range   SURGICAL PATHOLOGY      Surgical Pathology CASE: [id number] PATIENT: Elese Cerino Surgical Pathology Report     SPECIMEN SUBMITTED: A. Colon polyp, ascending; cbx  CLINICAL HISTORY: None provided  PRE-OPERATIVE DIAGNOSIS: Screening colonoscopy  POST-OPERATIVE DIAGNOSIS: External hemorrhoids, internal hemorrhoids, polyp     DIAGNOSIS: A.  COLON POLYP, ASCENDING; COLD BIOPSY: - TUBULAR ADENOMA. - NEGATIVE FOR HIGH-GRADE DYSPLASIA AND MALIGNANCY.   GROSS DESCRIPTION: A. Labeled: Cbx polyp ascending colon Received: In formalin Tissue fragment(s): 1 Size: 0.3 cm Description: Tan fragment Entirely submitted in one cassette.   Final Diagnosis performed by Bryan Lemma, MD.   Electronically signed 08/14/2017 8:41:44PM The electronic signature indicates that the named Attending  Pathologist has evaluated the specimen  Technical component performed at [facility], [address], [place], [place] [address] Lab: [phone] Dir: Rush Farmer, MD, MMM  Professional component performe d at [facility], [facility], [place], [place], [place] [address] Lab: [phone] Dir: Dellia Nims. Reuel Derby, MD       Assessment & Plan:   Problem List Items Addressed This Visit      Respiratory   Allergic rhinitis - Primary    Will restart flonase BID, continue claritin. Sudafed prn. Suspect post nasal drip causing her continued sxs. F/u if worsening or not improving on this regimen       Other Visit Diagnoses    Annual physical exam       Relevant Orders   CBC with Differential/Platelet   Comprehensive metabolic panel   Lipid Panel w/o Chol/HDL Ratio   TSH   UA/M w/rflx Culture, Routine       Follow up plan: Return in about 1 year (around 07/26/2019) for CPE.   LABORATORY TESTING:  - Pap smear: up to date  IMMUNIZATIONS:   - Tdap: Tetanus vaccination status reviewed: last tetanus booster within 10 years. - Influenza: Postponed to flu season  SCREENING: -Mammogram: Up to date  - Colonoscopy: Up to date   PATIENT COUNSELING:   Advised to take 1 mg of folate supplement per day if capable of pregnancy.   Sexuality: Discussed sexually transmitted diseases, partner selection, use of condoms, avoidance of unintended pregnancy  and contraceptive alternatives.   Advised to avoid cigarette smoking.  I discussed with the patient  that most people either abstain from alcohol or drink within safe limits (<=14/week and <=4 drinks/occasion for males, <=7/weeks and <= 3 drinks/occasion for females) and that the risk for alcohol disorders and other health effects rises proportionally with the number of drinks per week and how often a drinker exceeds daily limits.  Discussed cessation/primary prevention of drug use and availability of treatment for abuse.    Diet: Encouraged to adjust caloric intake to maintain  or achieve ideal body weight, to reduce intake of dietary saturated fat and total fat, to limit sodium intake by avoiding high sodium foods and not adding table salt, and to maintain adequate dietary potassium and calcium preferably from fresh fruits, vegetables, and low-fat dairy products.    stressed the importance of regular exercise  Injury prevention: Discussed safety belts, safety helmets, smoke detector, smoking near bedding or upholstery.   Dental health: Discussed importance of regular tooth brushing, flossing, and dental visits.    NEXT PREVENTATIVE PHYSICAL DUE IN 1 YEAR. Return in about 1 year (around 07/26/2019) for CPE.

## 2018-07-27 LAB — COMPREHENSIVE METABOLIC PANEL
ALT: 23 IU/L (ref 0–32)
AST: 23 IU/L (ref 0–40)
Albumin/Globulin Ratio: 2.4 — ABNORMAL HIGH (ref 1.2–2.2)
Albumin: 4.6 g/dL (ref 3.8–4.9)
Alkaline Phosphatase: 87 IU/L (ref 39–117)
BUN/Creatinine Ratio: 25 — ABNORMAL HIGH (ref 9–23)
BUN: 17 mg/dL (ref 6–24)
Bilirubin Total: 0.3 mg/dL (ref 0.0–1.2)
CO2: 22 mmol/L (ref 20–29)
Calcium: 9.2 mg/dL (ref 8.7–10.2)
Chloride: 103 mmol/L (ref 96–106)
Creatinine, Ser: 0.69 mg/dL (ref 0.57–1.00)
GFR calc Af Amer: 112 mL/min/{1.73_m2} (ref >59)
GFR calc non Af Amer: 97 mL/min/{1.73_m2} (ref >59)
Globulin, Total: 1.9 g/dL (ref 1.5–4.5)
Glucose: 88 mg/dL (ref 65–99)
Potassium: 4.4 mmol/L (ref 3.5–5.2)
Sodium: 143 mmol/L (ref 134–144)
Total Protein: 6.5 g/dL (ref 6.0–8.5)

## 2018-07-27 LAB — CBC WITH DIFFERENTIAL/PLATELET
Basophils Absolute: 0.1 10*3/uL (ref 0.0–0.2)
Basos: 1 %
EOS (ABSOLUTE): 0.1 10*3/uL (ref 0.0–0.4)
Eos: 2 %
Hematocrit: 40.9 % (ref 34.0–46.6)
Hemoglobin: 14 g/dL (ref 11.1–15.9)
Immature Grans (Abs): 0 10*3/uL (ref 0.0–0.1)
Immature Granulocytes: 0 %
Lymphocytes Absolute: 1.9 10*3/uL (ref 0.7–3.1)
Lymphs: 32 %
MCH: 30.1 pg (ref 26.6–33.0)
MCHC: 34.2 g/dL (ref 31.5–35.7)
MCV: 88 fL (ref 79–97)
Monocytes Absolute: 0.4 10*3/uL (ref 0.1–0.9)
Monocytes: 7 %
Neutrophils Absolute: 3.3 10*3/uL (ref 1.4–7.0)
Neutrophils: 58 %
Platelets: 222 10*3/uL (ref 150–450)
RBC: 4.65 x10E6/uL (ref 3.77–5.28)
RDW: 13.1 % (ref 11.7–15.4)
WBC: 5.8 10*3/uL (ref 3.4–10.8)

## 2018-07-27 LAB — LIPID PANEL W/O CHOL/HDL RATIO
Cholesterol, Total: 237 mg/dL — ABNORMAL HIGH (ref 100–199)
HDL: 51 mg/dL (ref >39)
LDL Calculated: 153 mg/dL — ABNORMAL HIGH (ref 0–99)
Triglycerides: 165 mg/dL — ABNORMAL HIGH (ref 0–149)
VLDL Cholesterol Cal: 33 mg/dL (ref 5–40)

## 2018-07-27 LAB — TSH: TSH: 1.52 u[IU]/mL (ref 0.450–4.500)

## 2018-07-28 LAB — UA/M W/RFLX CULTURE, ROUTINE
Bilirubin, UA: NEGATIVE
Glucose, UA: NEGATIVE
Ketones, UA: NEGATIVE
Nitrite, UA: NEGATIVE
Protein,UA: NEGATIVE
RBC, UA: NEGATIVE
Specific Gravity, UA: 1.025 (ref 1.005–1.030)
Urobilinogen, Ur: 0.2 mg/dL (ref 0.2–1.0)
pH, UA: 5 (ref 5.0–7.5)

## 2018-07-28 LAB — URINE CULTURE, REFLEX

## 2018-07-28 LAB — MICROSCOPIC EXAMINATION: RBC: NONE SEEN /hpf (ref 0–2)

## 2018-07-30 ENCOUNTER — Telehealth: Payer: Self-pay | Admitting: Unknown Physician Specialty

## 2018-07-30 MED ORDER — SULFAMETHOXAZOLE-TRIMETHOPRIM 800-160 MG PO TABS: 1.0000 | ORAL_TABLET | Freq: Two times a day (BID) | ORAL | 0 refills | Status: DC

## 2018-07-30 NOTE — Telephone Encounter (Signed)
Bactrim sent, she did have a small amount of bacteria in her recent urine specimen which did not grow anything out in culture but will tx based on worsening sxs. She should leave a repeat urine specimen if not improving after abx

## 2018-07-30 NOTE — Telephone Encounter (Signed)
Patient notified

## 2018-07-30 NOTE — Telephone Encounter (Signed)
Copied from [place] [id number]. Topic: Quick Communication - Rx Refill/Question >> Jul 30, 2018 11:24 AM Nils Flack wrote: Medication: something for uti  Has the patient contacted their pharmacy? No. (Agent: If no, request that the patient contact the pharmacy for the refill.) (Agent: If yes, when and what did the pharmacy advise?)  Preferred Pharmacy (with phone number or street name): [facility]  Pt says she was seen Thursday and now feel like she is getting uti. She is asking for something to be called in.   Cb for pt is [phone]  Agent: Please be advised that RX refills may take up to 3 business days. We ask that you follow-up with your pharmacy.

## 2019-07-30 ENCOUNTER — Encounter: Payer: 59 | Admitting: Family Medicine

## 2019-08-15 ENCOUNTER — Encounter: Payer: Self-pay | Admitting: Family Medicine

## 2019-08-15 ENCOUNTER — Other Ambulatory Visit: Payer: Self-pay

## 2019-08-15 ENCOUNTER — Ambulatory Visit (INDEPENDENT_AMBULATORY_CARE_PROVIDER_SITE_OTHER): Payer: Commercial Managed Care - PPO | Admitting: Family Medicine

## 2019-08-15 VITALS — BP 130/82 | HR 60 | Temp 97.9°F | Ht 64.0 in | Wt 194.0 lb

## 2019-08-15 DIAGNOSIS — Z1231 Encounter for screening mammogram for malignant neoplasm of breast: Secondary | ICD-10-CM | POA: Diagnosis not present

## 2019-08-15 DIAGNOSIS — J301 Allergic rhinitis due to pollen: Secondary | ICD-10-CM | POA: Diagnosis not present

## 2019-08-15 DIAGNOSIS — Z136 Encounter for screening for cardiovascular disorders: Secondary | ICD-10-CM | POA: Diagnosis not present

## 2019-08-15 DIAGNOSIS — Z0000 Encounter for general adult medical examination without abnormal findings: Secondary | ICD-10-CM | POA: Diagnosis not present

## 2019-08-15 DIAGNOSIS — B9689 Other specified bacterial agents as the cause of diseases classified elsewhere: Secondary | ICD-10-CM

## 2019-08-15 DIAGNOSIS — M2550 Pain in unspecified joint: Secondary | ICD-10-CM

## 2019-08-15 DIAGNOSIS — N760 Acute vaginitis: Secondary | ICD-10-CM | POA: Diagnosis not present

## 2019-08-15 LAB — WET PREP FOR TRICH, YEAST, CLUE
Clue Cell Exam: POSITIVE — AB
Trichomonas Exam: NEGATIVE
Yeast Exam: NEGATIVE

## 2019-08-15 NOTE — Patient Instructions (Signed)
Please call at this number ([facility]) to schedule your mammogram. [phone] 

## 2019-08-15 NOTE — Progress Notes (Signed)
BP 130/82   Pulse 60   Temp 97.9 F (36.6 C) (Oral)   Ht _0  (1.626 m)   Wt 194 lb (88 kg)   LMP  (LMP Unknown)   SpO2 99%   BMI 33.30 kg/m    Subjective:    Patient ID: Jeanette Henry, female    DOB: November 16, 1960, 59 y.o.   MRN: [id number]  HPI: Jeanette Henry is a 59 y.o. female presenting on 08/15/2019 for comprehensive medical examination. Current medical complaints include:see below  Having leg and toe cramps, b/l shoulders aching, left low back pain. Using topicals and patches for pain. Sometimes joint swelling, denies redness or rashes, fevers, chills. No known injuries.   Vaginal itching and discharge every few weeks for several months now. Over the counter monistat helps for a bit but it comes back. Denies concern for STIs, abdominal pain, N/V, rashes.   She currently lives with: Menopausal Symptoms: no  Depression Screen done today and results listed below:  Depression screen [facility] 2/9 08/15/2019 07/21/2017 04/19/2017 10/17/2016 07/11/2016  Decreased Interest 0 0 0 1 1  Down, Depressed, Hopeless 0 0 0 1 1  PHQ - 2 Score 0 0 0 2 2  Altered sleeping 1 0 1 2 -  Tired, decreased energy 0 1 0 1 -  Change in appetite 0 0 0 0 -  Feeling bad or failure about yourself  0 0 0 1 -  Trouble concentrating 0 0 0 0 -  Moving slowly or fidgety/restless 0 0 0 0 -  Suicidal thoughts 0 0 0 0 -  PHQ-9 Score _1 -    The patient does not have a history of falls. I did complete a risk assessment for falls. A plan of care for falls was documented.   Past Medical History:  Past Medical History:  Diagnosis Date  . Allergy   . Vitamin D deficiency     Surgical History:  Past Surgical History:  Procedure Laterality Date  . COLONOSCOPY WITH PROPOFOL N/A 08/11/2017   Procedure: COLONOSCOPY WITH PROPOFOL;  Surgeon: Virgel Manifold, MD;  Location: [facility] ENDOSCOPY;  Service: Gastroenterology;  Laterality: N/A;  . TUBAL LIGATION    . WRIST SURGERY Left     Medications:    Current Outpatient Medications on File Prior to Visit  Medication Sig  . Acetaminophen (TYLENOL PO) Take by mouth daily.  . fluticasone (FLONASE) 50 MCG/ACT nasal spray Place 1 spray into both nostrils 2 (two) times a day.  . Loratadine (CLARITIN PO) Take by mouth daily.   No current facility-administered medications on file prior to visit.    Allergies:  No Known Allergies  Social History:  Social History   Socioeconomic History  . Marital status: Married    Spouse name: Not on file  . Number of children: Not on file  . Years of education: Not on file  . Highest education level: Not on file  Occupational History  . Not on file  Tobacco Use  . Smoking status: Never Smoker  . Smokeless tobacco: Never Used  Vaping Use  . Vaping Use: Never used  Substance and Sexual Activity  . Alcohol use: No    Alcohol/week: 0.0 standard drinks  . Drug use: No  . Sexual activity: Yes    Birth control/protection: Surgical  Other Topics Concern  . Not on file  Social History Narrative  . Not on file   Social Determinants of Health   Financial Resource  Strain:   . Difficulty of Paying Living Expenses:   Food Insecurity:   . Worried About Charity fundraiser in the Last Year:   . Arboriculturist in the Last Year:   Transportation Needs:   . Film/video editor (Medical):   Marland Kitchen Lack of Transportation (Non-Medical):   Physical Activity:   . Days of Exercise per Week:   . Minutes of Exercise per Session:   Stress:   . Feeling of Stress :   Social Connections:   . Frequency of Communication with Friends and Family:   . Frequency of Social Gatherings with Friends and Family:   . Attends Religious Services:   . Active Member of Clubs or Organizations:   . Attends Archivist Meetings:   Marland Kitchen Marital Status:   Intimate Partner Violence:   . Fear of Current or Ex-Partner:   . Emotionally Abused:   Marland Kitchen Physically Abused:   . Sexually Abused:    Social History   Tobacco  Use  Smoking Status Never Smoker  Smokeless Tobacco Never Used   Social History   Substance and Sexual Activity  Alcohol Use No  . Alcohol/week: 0.0 standard drinks    Family History:  Family History  Problem Relation Age of Onset  . Hypertension Mother   . Cancer Mother        skin  . Heart disease Father   . Breast cancer Neg Hx     Past medical history, surgical history, medications, allergies, family history and social history reviewed with patient today and changes made to appropriate areas of the chart.   Review of Systems - General ROS: negative Psychological ROS: negative Ophthalmic ROS: negative ENT ROS: negative Allergy and Immunology ROS: negative Hematological and Lymphatic ROS: negative Endocrine ROS: negative Breast ROS: negative for breast lumps Respiratory ROS: no cough, shortness of breath, or wheezing Cardiovascular ROS: no chest pain or dyspnea on exertion Gastrointestinal ROS: no abdominal pain, change in bowel habits, or black or bloody stools Genito-Urinary ROS: positive for - vulvar/vaginal symptoms Musculoskeletal ROS: negative Neurological ROS: no TIA or stroke symptoms Dermatological ROS: negative All other ROS negative except what is listed above and in the HPI.      Objective:    BP 130/82   Pulse 60   Temp 97.9 F (36.6 C) (Oral)   Ht _0  (1.626 m)   Wt 194 lb (88 kg)   LMP  (LMP Unknown)   SpO2 99%   BMI 33.30 kg/m   Wt Readings from Last 3 Encounters:  08/15/19 194 lb (88 kg)  07/26/18 201 lb (91.2 kg)  04/05/18 197 lb (89.4 kg)    Physical Exam Vitals and nursing note reviewed. Exam conducted with a chaperone present.  Constitutional:      General: She is not in acute distress.    Appearance: She is well-developed.  HENT:     Head: Atraumatic.     Right Ear: External ear normal.     Left Ear: External ear normal.     Nose: Nose normal.     Mouth/Throat:     Pharynx: No oropharyngeal exudate.  Eyes:     General:  No scleral icterus.    Conjunctiva/sclera: Conjunctivae normal.     Pupils: Pupils are equal, round, and reactive to light.  Neck:     Thyroid: No thyromegaly.  Cardiovascular:     Rate and Rhythm: Normal rate and regular rhythm.     Heart sounds:  Normal heart sounds.  Pulmonary:     Effort: Pulmonary effort is normal. No respiratory distress.     Breath sounds: Normal breath sounds.  Chest:     Breasts:        Right: No mass, skin change or tenderness.        Left: No mass, skin change or tenderness.  Abdominal:     General: Bowel sounds are normal.     Palpations: Abdomen is soft. There is no mass.     Tenderness: There is no abdominal tenderness.  Genitourinary:    General: Normal vulva.     Vagina: Vaginal discharge present.  Musculoskeletal:        General: No tenderness. Normal range of motion.     Cervical back: Normal range of motion and neck supple.  Lymphadenopathy:     Cervical: No cervical adenopathy.     Upper Body:     Right upper body: No axillary adenopathy.     Left upper body: No axillary adenopathy.  Skin:    General: Skin is warm and dry.     Findings: No rash.  Neurological:     Mental Status: She is alert and oriented to person, place, and time.     Cranial Nerves: No cranial nerve deficit.  Psychiatric:        Behavior: Behavior normal.     Results for orders placed or performed in visit on 08/15/19  [place], YEAST, CLUE   Specimen: Vaginal Fluid   Vaginal Flui  Result Value Ref Range   Trichomonas Exam Negative Negative   Yeast Exam Negative Negative   Clue Cell Exam Positive (A) Negative  Microscopic Examination   Urine  Result Value Ref Range   WBC, UA 0-5 0 - 5 /hpf   RBC None seen 0 - 2 /hpf   Epithelial Cells (non renal) 0-10 0 - 10 /hpf   Bacteria, UA None seen None seen/Few  Urine Culture, Reflex   Urine  Result Value Ref Range   Urine Culture, Routine Final report    Organism ID, Bacteria No growth   CBC with  Differential/Platelet  Result Value Ref Range   WBC 7.2 3.4 - 10.8 x10E3/uL   RBC 4.72 3.77 - 5.28 x10E6/uL   Hemoglobin 14.1 11.1 - 15.9 g/dL   Hematocrit 41.6 34.0 - 46.6 %   MCV 88 79 - 97 fL   MCH 29.9 26.6 - 33.0 pg   MCHC 33.9 31 - 35 g/dL   RDW 12.6 11.7 - 15.4 %   Platelets 244 150 - 450 x10E3/uL   Neutrophils 61 Not Estab. %   Lymphs 31 Not Estab. %   Monocytes 6 Not Estab. %   Eos 1 Not Estab. %   Basos 1 Not Estab. %   Neutrophils Absolute 4.3 1 - 7 x10E3/uL   Lymphocytes Absolute 2.2 0 - 3 x10E3/uL   Monocytes Absolute 0.5 0 - 0 x10E3/uL   EOS (ABSOLUTE) 0.1 0.0 - 0.4 x10E3/uL   Basophils Absolute 0.1 0 - 0 x10E3/uL   Immature Granulocytes 0 Not Estab. %   Immature Grans (Abs) 0.0 0.0 - 0.1 x10E3/uL  Comprehensive metabolic panel  Result Value Ref Range   Glucose 81 65 - 99 mg/dL   BUN 12 6 - 24 mg/dL   Creatinine, Ser 0.80 0.57 - 1.00 mg/dL   GFR calc non Af Amer 82 >59 mL/min/1.73   GFR calc Af Amer 94 >59 mL/min/1.73   BUN/Creatinine  Ratio 15 9 - 23   Sodium 141 134 - 144 mmol/L   Potassium 3.8 3.5 - 5.2 mmol/L   Chloride 102 96 - 106 mmol/L   CO2 24 20 - 29 mmol/L   Calcium 9.4 8.7 - 10.2 mg/dL   Total Protein 6.5 6.0 - 8.5 g/dL   Albumin 4.5 3.8 - 4.9 g/dL   Globulin, Total 2.0 1.5 - 4.5 g/dL   Albumin/Globulin Ratio 2.3 (H) 1.2 - 2.2   Bilirubin Total 0.3 0.0 - 1.2 mg/dL   Alkaline Phosphatase 102 48 - 121 IU/L   AST 18 0 - 40 IU/L   ALT 13 0 - 32 IU/L  Lipid Panel w/o Chol/HDL Ratio  Result Value Ref Range   Cholesterol, Total 219 (H) 100 - 199 mg/dL   Triglycerides 239 (H) 0 - 149 mg/dL   HDL 45 >39 mg/dL   VLDL Cholesterol Cal 43 (H) 5 - 40 mg/dL   LDL Chol Calc (NIH) 131 (H) 0 - 99 mg/dL  TSH  Result Value Ref Range   TSH 3.290 0.450 - 4.500 uIU/mL  UA/M w/rflx Culture, Routine   Specimen: Urine   Urine  Result Value Ref Range   Specific Gravity, UA <1.005 (L) 1.005 - 1.030   pH, UA 6.0 5.0 - 7.5   Color, UA Yellow Yellow   Appearance  Ur Clear Clear   Leukocytes,UA Trace (A) Negative   Protein,UA Negative Negative/Trace   Glucose, UA Negative Negative   Ketones, UA Negative Negative   RBC, UA Negative Negative   Bilirubin, UA Negative Negative   Urobilinogen, Ur 0.2 0.2 - 1.0 mg/dL   Nitrite, UA Negative Negative   Microscopic Examination See below:    Urinalysis Reflex Comment   Uric acid  Result Value Ref Range   Uric Acid 5.3 3.0 - 7.2 mg/dL  Sed Rate (ESR)  Result Value Ref Range   Sed Rate 8 0 - 40 mm/hr  Rheumatoid Factor  Result Value Ref Range   Rhuematoid fact SerPl-aCnc 15.5 (H) 0.0 - 13.9 IU/mL  ANA w/Reflex  Result Value Ref Range   Anti Nuclear Antibody (ANA) Negative Negative      Assessment & Plan:   Problem List Items Addressed This Visit      Respiratory   Allergic rhinitis - Primary    Stable on claritin and flonase regimen, continue this regimen       Other Visit Diagnoses    Annual physical exam       Relevant Orders   CBC with Differential/Platelet (Completed)   Comprehensive metabolic panel (Completed)   TSH (Completed)   UA/M w/rflx Culture, Routine (Completed)   BV (bacterial vaginosis)       Wet prep showing BV infection. Tx with flagyl, probiotics, good vaginal hygiene. F/u if not resolving   Relevant Orders   WET PREP FOR [place], YEAST, CLUE (Completed)   Arthralgia, unspecified joint       WIll perform uric acid and autoimmune screening labs given persistence and severity of joint pains. Discussed symptomatic care in meantime   Relevant Orders   Uric acid (Completed)   Sed Rate (ESR) (Completed)   Rheumatoid Factor (Completed)   ANA w/Reflex (Completed)   Encounter for screening mammogram for malignant neoplasm of breast       Relevant Orders   MM 3D SCREEN BREAST BILATERAL   Screening for cardiovascular condition       Relevant Orders   Lipid Panel w/o Chol/HDL Ratio (Completed)  Follow up plan: Return in about 1 year (around 08/14/2020) for  CPE.   LABORATORY TESTING:  - Pap smear: up to date  IMMUNIZATIONS:   - Tdap: Tetanus vaccination status reviewed: last tetanus booster within 10 years. - Influenza: Postponed to flu season  SCREENING: -Mammogram: Ordered today  - Colonoscopy: Up to date   PATIENT COUNSELING:   Advised to take 1 mg of folate supplement per day if capable of pregnancy.   Sexuality: Discussed sexually transmitted diseases, partner selection, use of condoms, avoidance of unintended pregnancy  and contraceptive alternatives.   Advised to avoid cigarette smoking.  I discussed with the patient that most people either abstain from alcohol or drink within safe limits (<=14/week and <=4 drinks/occasion for males, <=7/weeks and <= 3 drinks/occasion for females) and that the risk for alcohol disorders and other health effects rises proportionally with the number of drinks per week and how often a drinker exceeds daily limits.  Discussed cessation/primary prevention of drug use and availability of treatment for abuse.   Diet: Encouraged to adjust caloric intake to maintain  or achieve ideal body weight, to reduce intake of dietary saturated fat and total fat, to limit sodium intake by avoiding high sodium foods and not adding table salt, and to maintain adequate dietary potassium and calcium preferably from fresh fruits, vegetables, and low-fat dairy products.    stressed the importance of regular exercise  Injury prevention: Discussed safety belts, safety helmets, smoke detector, smoking near bedding or upholstery.   Dental health: Discussed importance of regular tooth brushing, flossing, and dental visits.    NEXT PREVENTATIVE PHYSICAL DUE IN 1 YEAR. Return in about 1 year (around 08/14/2020) for CPE.

## 2019-08-16 ENCOUNTER — Other Ambulatory Visit: Payer: Self-pay | Admitting: Family Medicine

## 2019-08-16 LAB — CBC WITH DIFFERENTIAL/PLATELET
Basophils Absolute: 0.1 10*3/uL (ref 0.0–0.2)
Basos: 1 %
EOS (ABSOLUTE): 0.1 10*3/uL (ref 0.0–0.4)
Eos: 1 %
Hematocrit: 41.6 % (ref 34.0–46.6)
Hemoglobin: 14.1 g/dL (ref 11.1–15.9)
Immature Grans (Abs): 0 10*3/uL (ref 0.0–0.1)
Immature Granulocytes: 0 %
Lymphocytes Absolute: 2.2 10*3/uL (ref 0.7–3.1)
Lymphs: 31 %
MCH: 29.9 pg (ref 26.6–33.0)
MCHC: 33.9 g/dL (ref 31.5–35.7)
MCV: 88 fL (ref 79–97)
Monocytes Absolute: 0.5 10*3/uL (ref 0.1–0.9)
Monocytes: 6 %
Neutrophils Absolute: 4.3 10*3/uL (ref 1.4–7.0)
Neutrophils: 61 %
Platelets: 244 10*3/uL (ref 150–450)
RBC: 4.72 x10E6/uL (ref 3.77–5.28)
RDW: 12.6 % (ref 11.7–15.4)
WBC: 7.2 10*3/uL (ref 3.4–10.8)

## 2019-08-16 LAB — COMPREHENSIVE METABOLIC PANEL
ALT: 13 IU/L (ref 0–32)
AST: 18 IU/L (ref 0–40)
Albumin/Globulin Ratio: 2.3 — ABNORMAL HIGH (ref 1.2–2.2)
Albumin: 4.5 g/dL (ref 3.8–4.9)
Alkaline Phosphatase: 102 IU/L (ref 48–121)
BUN/Creatinine Ratio: 15 (ref 9–23)
BUN: 12 mg/dL (ref 6–24)
Bilirubin Total: 0.3 mg/dL (ref 0.0–1.2)
CO2: 24 mmol/L (ref 20–29)
Calcium: 9.4 mg/dL (ref 8.7–10.2)
Chloride: 102 mmol/L (ref 96–106)
Creatinine, Ser: 0.8 mg/dL (ref 0.57–1.00)
GFR calc Af Amer: 94 mL/min/{1.73_m2} (ref >59)
GFR calc non Af Amer: 82 mL/min/{1.73_m2} (ref >59)
Globulin, Total: 2 g/dL (ref 1.5–4.5)
Glucose: 81 mg/dL (ref 65–99)
Potassium: 3.8 mmol/L (ref 3.5–5.2)
Sodium: 141 mmol/L (ref 134–144)
Total Protein: 6.5 g/dL (ref 6.0–8.5)

## 2019-08-16 LAB — LIPID PANEL W/O CHOL/HDL RATIO
Cholesterol, Total: 219 mg/dL — ABNORMAL HIGH (ref 100–199)
HDL: 45 mg/dL (ref >39)
LDL Chol Calc (NIH): 131 mg/dL — ABNORMAL HIGH (ref 0–99)
Triglycerides: 239 mg/dL — ABNORMAL HIGH (ref 0–149)
VLDL Cholesterol Cal: 43 mg/dL — ABNORMAL HIGH (ref 5–40)

## 2019-08-16 LAB — URIC ACID: Uric Acid: 5.3 mg/dL (ref 3.0–7.2)

## 2019-08-16 LAB — RHEUMATOID FACTOR: Rheumatoid fact SerPl-aCnc: 15.5 IU/mL — ABNORMAL HIGH (ref 0.0–13.9)

## 2019-08-16 LAB — ANA W/REFLEX: Anti Nuclear Antibody (ANA): NEGATIVE

## 2019-08-16 LAB — SEDIMENTATION RATE: Sed Rate: 8 mm/hr (ref 0–40)

## 2019-08-16 LAB — TSH: TSH: 3.29 u[IU]/mL (ref 0.450–4.500)

## 2019-08-16 MED ORDER — METRONIDAZOLE 500 MG PO TABS: 500.0000 mg | ORAL_TABLET | Freq: Two times a day (BID) | ORAL | 0 refills | Status: DC

## 2019-08-17 LAB — UA/M W/RFLX CULTURE, ROUTINE
Bilirubin, UA: NEGATIVE
Glucose, UA: NEGATIVE
Ketones, UA: NEGATIVE
Nitrite, UA: NEGATIVE
Protein,UA: NEGATIVE
RBC, UA: NEGATIVE
Specific Gravity, UA: <1.005 — ABNORMAL LOW (ref 1.005–1.030)
Urobilinogen, Ur: 0.2 mg/dL (ref 0.2–1.0)
pH, UA: 6 (ref 5.0–7.5)

## 2019-08-17 LAB — URINE CULTURE, REFLEX: Organism ID, Bacteria: NO GROWTH

## 2019-08-17 LAB — MICROSCOPIC EXAMINATION
Bacteria, UA: NONE SEEN
RBC, Urine: NONE SEEN /hpf (ref 0–2)

## 2019-08-26 NOTE — Assessment & Plan Note (Signed)
Stable on claritin and flonase regimen, continue this regimen

## 2019-09-04 ENCOUNTER — Ambulatory Visit
Admission: RE | Admit: 2019-09-04 | Discharge: 2019-09-04 | Disposition: A | Discharge status: Home / Self Care | Payer: Commercial Managed Care - PPO | Source: Ambulatory Visit | Attending: Family Medicine | Admitting: Family Medicine

## 2019-09-04 DIAGNOSIS — Z1231 Encounter for screening mammogram for malignant neoplasm of breast: Secondary | ICD-10-CM | POA: Diagnosis present

## 2019-10-17 ENCOUNTER — Encounter: Payer: Self-pay | Admitting: Family Medicine

## 2020-08-17 ENCOUNTER — Encounter: Payer: 59 | Admitting: Family Medicine

## 2020-08-17 ENCOUNTER — Encounter: Payer: Self-pay | Admitting: Family Medicine

## 2020-08-17 ENCOUNTER — Other Ambulatory Visit: Payer: Self-pay

## 2020-08-17 ENCOUNTER — Ambulatory Visit (INDEPENDENT_AMBULATORY_CARE_PROVIDER_SITE_OTHER): Payer: Commercial Managed Care - PPO | Admitting: Family Medicine

## 2020-08-17 VITALS — BP 118/75 | HR 60 | Temp 98.2°F | Ht 64.5 in | Wt 194.4 lb

## 2020-08-17 DIAGNOSIS — Z0000 Encounter for general adult medical examination without abnormal findings: Secondary | ICD-10-CM

## 2020-08-17 DIAGNOSIS — Z8639 Personal history of other endocrine, nutritional and metabolic disease: Secondary | ICD-10-CM

## 2020-08-17 DIAGNOSIS — N644 Mastodynia: Secondary | ICD-10-CM | POA: Diagnosis not present

## 2020-08-17 LAB — URINALYSIS, ROUTINE W REFLEX MICROSCOPIC
Bilirubin, UA: NEGATIVE
Glucose, UA: NEGATIVE
Ketones, UA: NEGATIVE
Nitrite, UA: NEGATIVE
Protein,UA: NEGATIVE
RBC, UA: NEGATIVE
Specific Gravity, UA: 1.02 (ref 1.005–1.030)
Urobilinogen, Ur: 0.2 mg/dL (ref 0.2–1.0)
pH, UA: 5 (ref 5.0–7.5)

## 2020-08-17 LAB — MICROSCOPIC EXAMINATION
Bacteria, UA: NONE SEEN
RBC, Urine: NONE SEEN /hpf (ref 0–2)
WBC, UA: NONE SEEN /hpf (ref 0–5)

## 2020-08-17 NOTE — Progress Notes (Signed)
BP 118/75   Pulse 60   Temp 98.2 F (36.8 C)   Ht 5' 4.5" (1.638 m)   Wt 194 lb 6.4 oz (88.2 kg)   LMP  (LMP Unknown)   SpO2 99%   BMI 32.85 kg/m    Subjective:    Patient ID: Jeanette Henry, female    DOB: [birth year], 60 y.o.   MRN: [id number]  HPI: Jeanette Henry is a 60 y.o. female presenting on 08/17/2020 for comprehensive medical examination. Current medical complaints include:  BREAST PAIN Duration : 3 month Location: 11:00 L breast Onset: gradual Severity:  mild Quality: sore Frequency: intermittent Redness: no Swelling: no Trauma: no trauma Breastfeeding: no Associated with menstral cycle: no Nipple discharge: no Breast lump: yes Status: stable Treatments attempted: none Previous mammogram: yes  Menopausal Symptoms: yes- hot flashes  Depression Screen done today and results listed below:  Depression screen [facility] 2/9 08/17/2020 08/15/2019 07/21/2017 04/19/2017 10/17/2016  Decreased Interest 0 0 0 0 1  Down, Depressed, Hopeless 0 0 0 0 1  PHQ - 2 Score 0 0 0 0 2  Altered sleeping - 1 0 1 2  Tired, decreased energy - 0 1 0 1  Change in appetite - 0 0 0 0  Feeling bad or failure about yourself  - 0 0 0 1  Trouble concentrating - 0 0 0 0  Moving slowly or fidgety/restless - 0 0 0 0  Suicidal thoughts - 0 0 0 0  PHQ-9 Score - '1 1 1 6     ' Past Medical History:  Past Medical History:  Diagnosis Date   Allergy    Vitamin D deficiency     Surgical History:  Past Surgical History:  Procedure Laterality Date   COLONOSCOPY WITH PROPOFOL N/A 08/11/2017   Procedure: COLONOSCOPY WITH PROPOFOL;  Surgeon: Virgel Manifold, MD;  Location: [facility] ENDOSCOPY;  Service: Gastroenterology;  Laterality: N/A;   TUBAL LIGATION     WRIST SURGERY Left     Medications:  Current Outpatient Medications on File Prior to Visit  Medication Sig   Acetaminophen (TYLENOL PO) Take by mouth daily.   fluticasone (FLONASE) 50 MCG/ACT nasal spray Place 1 spray into both  nostrils 2 (two) times a day.   Loratadine (CLARITIN PO) Take by mouth daily.   VITAMIN D, CHOLECALCIFEROL, PO Take by mouth daily.   No current facility-administered medications on file prior to visit.    Allergies:  No Known Allergies  Social History:  Social History   Socioeconomic History   Marital status: Married    Spouse name: Not on file   Number of children: Not on file   Years of education: Not on file   Highest education level: Not on file  Occupational History   Not on file  Tobacco Use   Smoking status: Never   Smokeless tobacco: Never  Vaping Use   Vaping Use: Never used  Substance and Sexual Activity   Alcohol use: No    Alcohol/week: 0.0 standard drinks   Drug use: No   Sexual activity: Yes    Birth control/protection: Surgical  Other Topics Concern   Not on file  Social History Narrative   Not on file   Social Determinants of Health   Financial Resource Strain: Not on file  Food Insecurity: Not on file  Transportation Needs: Not on file  Physical Activity: Not on file  Stress: Not on file  Social Connections: Not on file  Intimate Partner Violence: Not  on file   Social History   Tobacco Use  Smoking Status Never  Smokeless Tobacco Never   Social History   Substance and Sexual Activity  Alcohol Use No   Alcohol/week: 0.0 standard drinks    Family History:  Family History  Problem Relation Age of Onset   Hypertension Mother    Cancer Mother        skin   Heart disease Father    Breast cancer Neg Hx     Past medical history, surgical history, medications, allergies, family history and social history reviewed with patient today and changes made to appropriate areas of the chart.   Review of Systems  Constitutional:  Positive for diaphoresis. Negative for chills, fever, malaise/fatigue and weight loss.  HENT: Negative.    Eyes: Negative.   Respiratory: Negative.    Cardiovascular: Negative.   Gastrointestinal: Negative.    Genitourinary:  Positive for frequency. Negative for dysuria, flank pain, hematuria and urgency.  Musculoskeletal: Negative.   Skin: Negative.   Neurological: Negative.   Endo/Heme/Allergies:  Positive for environmental allergies. Negative for polydipsia. Bruises/bleeds easily.  Psychiatric/Behavioral: Negative.    All other ROS negative except what is listed above and in the HPI.      Objective:    BP 118/75   Pulse 60   Temp 98.2 F (36.8 C)   Ht 5' 4.5" (1.638 m)   Wt 194 lb 6.4 oz (88.2 kg)   LMP  (LMP Unknown)   SpO2 99%   BMI 32.85 kg/m   Wt Readings from Last 3 Encounters:  08/17/20 194 lb 6.4 oz (88.2 kg)  08/15/19 194 lb (88 kg)  07/26/18 201 lb (91.2 kg)    Physical Exam Vitals and nursing note reviewed. Exam conducted with a chaperone present.  Constitutional:      General: She is not in acute distress.    Appearance: Normal appearance. She is not ill-appearing, toxic-appearing or diaphoretic.  HENT:     Head: Normocephalic and atraumatic.     Right Ear: Tympanic membrane, ear canal and external ear normal. There is no impacted cerumen.     Left Ear: Tympanic membrane, ear canal and external ear normal. There is no impacted cerumen.     Nose: Nose normal. No congestion or rhinorrhea.     Mouth/Throat:     Mouth: Mucous membranes are moist.     Pharynx: Oropharynx is clear. No oropharyngeal exudate or posterior oropharyngeal erythema.  Eyes:     General: No scleral icterus.       Right eye: No discharge.        Left eye: No discharge.     Extraocular Movements: Extraocular movements intact.     Conjunctiva/sclera: Conjunctivae normal.     Pupils: Pupils are equal, round, and reactive to light.  Neck:     Vascular: No carotid bruit.  Cardiovascular:     Rate and Rhythm: Normal rate and regular rhythm.     Pulses: Normal pulses.     Heart sounds: No murmur heard.   No friction rub. No gallop.  Pulmonary:     Effort: Pulmonary effort is normal. No  respiratory distress.     Breath sounds: Normal breath sounds. No stridor. No wheezing, rhonchi or rales.  Chest:     Chest wall: No tenderness.  Breasts:    Right: No swelling, bleeding, inverted nipple, mass, nipple discharge, skin change or tenderness.     Left: Tenderness present. No swelling, bleeding, inverted nipple, mass,  nipple discharge or skin change.    Abdominal:     General: Abdomen is flat. Bowel sounds are normal. There is no distension.     Palpations: Abdomen is soft. There is no mass.     Tenderness: There is no abdominal tenderness. There is no right CVA tenderness, left CVA tenderness, guarding or rebound.     Hernia: No hernia is present.  Genitourinary:    Comments: Pelvic exams deferred with shared decision making Musculoskeletal:        General: No swelling, tenderness, deformity or signs of injury.     Cervical back: Normal range of motion and neck supple. No rigidity. No muscular tenderness.     Right lower leg: No edema.     Left lower leg: No edema.  Lymphadenopathy:     Cervical: No cervical adenopathy.  Skin:    General: Skin is warm and dry.     Capillary Refill: Capillary refill takes less than 2 seconds.     Coloration: Skin is not jaundiced or pale.     Findings: No bruising, erythema, lesion or rash.  Neurological:     General: No focal deficit present.     Mental Status: She is alert and oriented to person, place, and time. Mental status is at baseline.     Cranial Nerves: No cranial nerve deficit.     Sensory: No sensory deficit.     Motor: No weakness.     Coordination: Coordination normal.     Gait: Gait normal.     Deep Tendon Reflexes: Reflexes normal.  Psychiatric:        Mood and Affect: Mood normal.        Behavior: Behavior normal.        Thought Content: Thought content normal.        Judgment: Judgment normal.    Results for orders placed or performed in visit on 08/15/19  WET PREP FOR [place], YEAST, CLUE   Specimen: Vaginal  Fluid   Vaginal Flui  Result Value Ref Range   Trichomonas Exam Negative Negative   Yeast Exam Negative Negative   Clue Cell Exam Positive (A) Negative  Microscopic Examination   Urine  Result Value Ref Range   WBC, UA 0-5 0 - 5 /hpf   RBC None seen 0 - 2 /hpf   Epithelial Cells (non renal) 0-10 0 - 10 /hpf   Bacteria, UA None seen None seen/Few  Urine Culture, Reflex   Urine  Result Value Ref Range   Urine Culture, Routine Final report    Organism ID, Bacteria No growth   CBC with Differential/Platelet  Result Value Ref Range   WBC 7.2 3.4 - 10.8 x10E3/uL   RBC 4.72 3.77 - 5.28 x10E6/uL   Hemoglobin 14.1 11.1 - 15.9 g/dL   Hematocrit 41.6 34.0 - 46.6 %   MCV 88 79 - 97 fL   MCH 29.9 26.6 - 33.0 pg   MCHC 33.9 31.5 - 35.7 g/dL   RDW 12.6 11.7 - 15.4 %   Platelets 244 150 - 450 x10E3/uL   Neutrophils 61 Not Estab. %   Lymphs 31 Not Estab. %   Monocytes 6 Not Estab. %   Eos 1 Not Estab. %   Basos 1 Not Estab. %   Neutrophils Absolute 4.3 1.4 - 7.0 x10E3/uL   Lymphocytes Absolute 2.2 0.7 - 3.1 x10E3/uL   Monocytes Absolute 0.5 0.1 - 0.9 x10E3/uL   EOS (ABSOLUTE) 0.1 0.0 - 0.4 x10E3/uL  Basophils Absolute 0.1 0.0 - 0.2 x10E3/uL   Immature Granulocytes 0 Not Estab. %   Immature Grans (Abs) 0.0 0.0 - 0.1 x10E3/uL  Comprehensive metabolic panel  Result Value Ref Range   Glucose 81 65 - 99 mg/dL   BUN 12 6 - 24 mg/dL   Creatinine, Ser 0.80 0.57 - 1.00 mg/dL   GFR calc non Af Amer 82 >59 mL/min/1.73   GFR calc Af Amer 94 >59 mL/min/1.73   BUN/Creatinine Ratio 15 9 - 23   Sodium 141 134 - 144 mmol/L   Potassium 3.8 3.5 - 5.2 mmol/L   Chloride 102 96 - 106 mmol/L   CO2 24 20 - 29 mmol/L   Calcium 9.4 8.7 - 10.2 mg/dL   Total Protein 6.5 6.0 - 8.5 g/dL   Albumin 4.5 3.8 - 4.9 g/dL   Globulin, Total 2.0 1.5 - 4.5 g/dL   Albumin/Globulin Ratio 2.3 (H) 1.2 - 2.2   Bilirubin Total 0.3 0.0 - 1.2 mg/dL   Alkaline Phosphatase 102 48 - 121 IU/L   AST 18 0 - 40 IU/L   ALT 13  0 - 32 IU/L  Lipid Panel w/o Chol/HDL Ratio  Result Value Ref Range   Cholesterol, Total 219 (H) 100 - 199 mg/dL   Triglycerides 239 (H) 0 - 149 mg/dL   HDL 45 >39 mg/dL   VLDL Cholesterol Cal 43 (H) 5 - 40 mg/dL   LDL Chol Calc (NIH) 131 (H) 0 - 99 mg/dL  TSH  Result Value Ref Range   TSH 3.290 0.450 - 4.500 uIU/mL  UA/M w/rflx Culture, Routine   Specimen: Urine   Urine  Result Value Ref Range   Specific Gravity, UA <1.005 (L) 1.005 - 1.030   pH, UA 6.0 5.0 - 7.5   Color, UA Yellow Yellow   Appearance Ur Clear Clear   Leukocytes,UA Trace (A) Negative   Protein,UA Negative Negative/Trace   Glucose, UA Negative Negative   Ketones, UA Negative Negative   RBC, UA Negative Negative   Bilirubin, UA Negative Negative   Urobilinogen, Ur 0.2 0.2 - 1.0 mg/dL   Nitrite, UA Negative Negative   Microscopic Examination See below:    Urinalysis Reflex Comment   Uric acid  Result Value Ref Range   Uric Acid 5.3 3.0 - 7.2 mg/dL  Sed Rate (ESR)  Result Value Ref Range   Sed Rate 8 0 - 40 mm/hr  Rheumatoid Factor  Result Value Ref Range   Rhuematoid fact SerPl-aCnc 15.5 (H) 0.0 - 13.9 IU/mL  ANA w/Reflex  Result Value Ref Range   Anti Nuclear Antibody (ANA) Negative Negative      Assessment & Plan:   Problem List Items Addressed This Visit   None Visit Diagnoses     Routine general medical examination at a health care facility    -  Primary   Vaccines up to date/declined. Screening labs checked today. Pap and colonoscopy up to date. Mammogram ordered. Continue diet and exercise. Call with concerns.    Relevant Orders   CBC with Differential/Platelet   Comprehensive metabolic panel   Lipid Panel w/o Chol/HDL Ratio   Urinalysis, Routine w reflex microscopic   TSH   Breast pain       Will obtain mammogram. Await results. Treat as needed.    Relevant Orders   MM DIAG BREAST TOMO BILATERAL   US BREAST LTD UNI LEFT INC AXILLA   MM Digital Diagnostic Unilat L   History of  vitamin D  deficiency       Rechecking labs today. Await results. Treat as needed.    Relevant Orders   VITAMIN D 25 Hydroxy (Vit-D Deficiency, Fractures)        Follow up plan: Return in about 1 year (around 08/17/2021) for physical.   LABORATORY TESTING:  - Pap smear: up to date  IMMUNIZATIONS:   - Tdap: Tetanus vaccination status reviewed: last tetanus booster within 10 years. - Influenza: Postponed to flu season - Pneumovax: Not applicable - Prevnar: Not applicable - COVID Refused - Zostavax vaccine: Given elsewhere  SCREENING: -Mammogram: Ordered today  - Colonoscopy: Up to date   PATIENT COUNSELING:   Advised to take 1 mg of folate supplement per day if capable of pregnancy.   Sexuality: Discussed sexually transmitted diseases, partner selection, use of condoms, avoidance of unintended pregnancy  and contraceptive alternatives.   Advised to avoid cigarette smoking.  I discussed with the patient that most people either abstain from alcohol or drink within safe limits (<=14/week and <=4 drinks/occasion for males, <=7/weeks and <= 3 drinks/occasion for females) and that the risk for alcohol disorders and other health effects rises proportionally with the number of drinks per week and how often a drinker exceeds daily limits.  Discussed cessation/primary prevention of drug use and availability of treatment for abuse.   Diet: Encouraged to adjust caloric intake to maintain  or achieve ideal body weight, to reduce intake of dietary saturated fat and total fat, to limit sodium intake by avoiding high sodium foods and not adding table salt, and to maintain adequate dietary potassium and calcium preferably from fresh fruits, vegetables, and low-fat dairy products.    stressed the importance of regular exercise  Injury prevention: Discussed safety belts, safety helmets, smoke detector, smoking near bedding or upholstery.   Dental health: Discussed importance of regular tooth  brushing, flossing, and dental visits.    NEXT PREVENTATIVE PHYSICAL DUE IN 1 YEAR. Return in about 1 year (around 08/17/2021) for physical.

## 2020-08-17 NOTE — Patient Instructions (Signed)
Call to schedule your mammogram: [facility]  Address: [address], [place], [place] [address]  Phone: [phone]  

## 2020-08-18 ENCOUNTER — Encounter: Payer: Self-pay | Admitting: Family Medicine

## 2020-08-18 LAB — CBC WITH DIFFERENTIAL/PLATELET
Basophils Absolute: 0.1 10*3/uL (ref 0.0–0.2)
Basos: 1 %
EOS (ABSOLUTE): 0.1 10*3/uL (ref 0.0–0.4)
Eos: 1 %
Hematocrit: 43 % (ref 34.0–46.6)
Hemoglobin: 14.5 g/dL (ref 11.1–15.9)
Immature Grans (Abs): 0 10*3/uL (ref 0.0–0.1)
Immature Granulocytes: 0 %
Lymphocytes Absolute: 2.2 10*3/uL (ref 0.7–3.1)
Lymphs: 31 %
MCH: 30.3 pg (ref 26.6–33.0)
MCHC: 33.7 g/dL (ref 31.5–35.7)
MCV: 90 fL (ref 79–97)
Monocytes Absolute: 0.4 10*3/uL (ref 0.1–0.9)
Monocytes: 5 %
Neutrophils Absolute: 4.4 10*3/uL (ref 1.4–7.0)
Neutrophils: 62 %
Platelets: 244 10*3/uL (ref 150–450)
RBC: 4.78 x10E6/uL (ref 3.77–5.28)
RDW: 12.6 % (ref 11.7–15.4)
WBC: 7.2 10*3/uL (ref 3.4–10.8)

## 2020-08-18 LAB — COMPREHENSIVE METABOLIC PANEL
ALT: 14 IU/L (ref 0–32)
AST: 19 IU/L (ref 0–40)
Albumin/Globulin Ratio: 2.6 — ABNORMAL HIGH (ref 1.2–2.2)
Albumin: 4.6 g/dL (ref 3.8–4.9)
Alkaline Phosphatase: 98 IU/L (ref 44–121)
BUN/Creatinine Ratio: 15 (ref 9–23)
BUN: 12 mg/dL (ref 6–24)
Bilirubin Total: 0.3 mg/dL (ref 0.0–1.2)
CO2: 23 mmol/L (ref 20–29)
Calcium: 9.4 mg/dL (ref 8.7–10.2)
Chloride: 102 mmol/L (ref 96–106)
Creatinine, Ser: 0.79 mg/dL (ref 0.57–1.00)
Globulin, Total: 1.8 g/dL (ref 1.5–4.5)
Glucose: 84 mg/dL (ref 65–99)
Potassium: 4.2 mmol/L (ref 3.5–5.2)
Sodium: 142 mmol/L (ref 134–144)
Total Protein: 6.4 g/dL (ref 6.0–8.5)
eGFR: 86 mL/min/{1.73_m2} (ref >59)

## 2020-08-18 LAB — LIPID PANEL W/O CHOL/HDL RATIO
Cholesterol, Total: 208 mg/dL — ABNORMAL HIGH (ref 100–199)
HDL: 47 mg/dL (ref >39)
LDL Chol Calc (NIH): 122 mg/dL — ABNORMAL HIGH (ref 0–99)
Triglycerides: 222 mg/dL — ABNORMAL HIGH (ref 0–149)
VLDL Cholesterol Cal: 39 mg/dL (ref 5–40)

## 2020-08-18 LAB — VITAMIN D 25 HYDROXY (VIT D DEFICIENCY, FRACTURES): Vit D, 25-Hydroxy: 29 ng/mL — ABNORMAL LOW (ref 30.0–100.0)

## 2020-08-18 LAB — TSH: TSH: 1.28 u[IU]/mL (ref 0.450–4.500)

## 2020-08-19 ENCOUNTER — Ambulatory Visit
Admission: RE | Admit: 2020-08-19 | Discharge: 2020-08-19 | Disposition: A | Discharge status: Home / Self Care | Payer: Commercial Managed Care - PPO | Source: Ambulatory Visit | Attending: Family Medicine | Admitting: Family Medicine

## 2020-08-19 ENCOUNTER — Other Ambulatory Visit: Payer: Self-pay

## 2020-08-19 DIAGNOSIS — N644 Mastodynia: Secondary | ICD-10-CM

## 2020-08-20 ENCOUNTER — Encounter: Payer: Self-pay | Admitting: Family Medicine

## 2021-02-10 ENCOUNTER — Telehealth: Payer: Self-pay | Admitting: Nurse Practitioner

## 2021-02-10 NOTE — Telephone Encounter (Signed)
Pt dropped off form (proof of physical) for Dr. Wynetta Emery to complete.  When completed, please call pt at [phone].  Placed in providers folder for completion.

## 2021-02-11 NOTE — Telephone Encounter (Signed)
This is not my patient.

## 2021-02-11 NOTE — Telephone Encounter (Signed)
Yes, I can do this!

## 2021-08-15 DIAGNOSIS — E7800 Pure hypercholesterolemia, unspecified: Secondary | ICD-10-CM | POA: Insufficient documentation

## 2021-08-15 NOTE — Patient Instructions (Incomplete)

## 2021-08-20 ENCOUNTER — Encounter: Payer: Self-pay | Admitting: Nurse Practitioner

## 2021-08-20 ENCOUNTER — Ambulatory Visit (INDEPENDENT_AMBULATORY_CARE_PROVIDER_SITE_OTHER): Payer: Commercial Managed Care - PPO | Admitting: Nurse Practitioner

## 2021-08-20 ENCOUNTER — Other Ambulatory Visit (HOSPITAL_COMMUNITY)
Admission: RE | Admit: 2021-08-20 | Discharge: 2021-08-20 | Disposition: A | Discharge status: Home / Self Care | Payer: Commercial Managed Care - PPO | Source: Ambulatory Visit | Attending: Nurse Practitioner | Admitting: Nurse Practitioner

## 2021-08-20 VITALS — BP 130/79 | HR 61 | Temp 97.9°F | Ht 65.25 in | Wt 190.4 lb

## 2021-08-20 DIAGNOSIS — Z0000 Encounter for general adult medical examination without abnormal findings: Secondary | ICD-10-CM

## 2021-08-20 DIAGNOSIS — Z1231 Encounter for screening mammogram for malignant neoplasm of breast: Secondary | ICD-10-CM | POA: Diagnosis not present

## 2021-08-20 DIAGNOSIS — Z780 Asymptomatic menopausal state: Secondary | ICD-10-CM | POA: Diagnosis not present

## 2021-08-20 DIAGNOSIS — E559 Vitamin D deficiency, unspecified: Secondary | ICD-10-CM | POA: Diagnosis not present

## 2021-08-20 DIAGNOSIS — J301 Allergic rhinitis due to pollen: Secondary | ICD-10-CM

## 2021-08-20 DIAGNOSIS — N952 Postmenopausal atrophic vaginitis: Secondary | ICD-10-CM | POA: Diagnosis not present

## 2021-08-20 DIAGNOSIS — E7800 Pure hypercholesterolemia, unspecified: Secondary | ICD-10-CM

## 2021-08-20 DIAGNOSIS — Z124 Encounter for screening for malignant neoplasm of cervix: Secondary | ICD-10-CM | POA: Diagnosis present

## 2021-08-20 MED ORDER — MONTELUKAST SODIUM 10 MG PO TABS: 10.0000 mg | ORAL_TABLET | Freq: Every day | ORAL | 6 refills | Status: DC

## 2021-08-20 NOTE — Assessment & Plan Note (Signed)
Noted on past labs, ASCVD 3.9%. Continue diet focus and recheck labs today.

## 2021-08-20 NOTE — Progress Notes (Signed)
BP 130/79   Pulse 61   Temp 97.9 F (36.6 C) (Oral)   Ht 5' 5.25" (1.657 m)   Wt 190 lb 6.4 oz (86.4 kg)   LMP  (LMP Unknown)   SpO2 97%   BMI 31.44 kg/m    Subjective:    Patient ID: Jeanette Henry, female    DOB: 1960/08/25, 61 y.o.   MRN: [id number]  HPI: Jeanette Henry is a 61 y.o. female presenting on 08/20/2021 for comprehensive medical examination. Current medical complaints include:none  She currently lives with: significant other Menopausal Symptoms: no -- no further cycles, last one about 10 years ago  ALLERGIES Ongoing issues, has tried all over the counter medications without benefit. Duration: chronic Runny nose: yes "clear Nasal congestion: yes Nasal itching: yes Sneezing: yes Eye swelling, itching or discharge: no Post nasal drip: yes Cough: yes Sinus pressure: yes  Ear pain: none Ear pressure: none Fever: none Symptoms occur seasonally: yes Symptoms occur perenially: no Satisfied with current treatment: no Allergist evaluation in past: no Allergen injection immunotherapy: no Recurrent sinus infections: no ENT evaluation in past: no Known environmental allergy: no Indoor pets: no History of asthma: no Current allergy medications: Claritin, sinus medication, Flonase Treatments attempted: allegra, allegra D, benadryl, claritin, claritin D, flonase, singulair, zyrtec, zyrtec D, and xyzal   The 10-year ASCVD risk score (Arnett DK, et al., 2019) is: 3.9%   Values used to calculate the score:     Age: 45 years     Sex: Female     Is Non-Hispanic African American: No     Diabetic: No     Tobacco smoker: No     Systolic Blood Pressure: 130 mmHg     Is BP treated: No     HDL Cholesterol: 47 mg/dL     Total Cholesterol: 208 mg/dL   Depression Screen done today and results listed below:     08/20/2021    8:13 AM 08/17/2020    8:11 AM 08/15/2019    9:19 AM 07/21/2017    8:09 AM 04/19/2017    4:16 PM  Depression screen PHQ 2/9  Decreased  Interest 0 0 0 0 0  Down, Depressed, Hopeless 0 0 0 0 0  PHQ - 2 Score 0 0 0 0 0  Altered sleeping 0  1 0 1  Tired, decreased energy 1  0 1 0  Change in appetite 0  0 0 0  Feeling bad or failure about yourself  0  0 0 0  Trouble concentrating 0  0 0 0  Moving slowly or fidgety/restless 0  0 0 0  Suicidal thoughts 0  0 0 0  PHQ-9 Score 1  1 1 1   Difficult doing work/chores Not difficult at all        The patient does not have a history of falls. I did not complete a risk assessment for falls. A plan of care for falls was not documented.  Functional Status Survey: Is the patient deaf or have difficulty hearing?: No Does the patient have difficulty seeing, even when wearing glasses/contacts?: No Does the patient have difficulty concentrating, remembering, or making decisions?: No Does the patient have difficulty walking or climbing stairs?: No Does the patient have difficulty dressing or bathing?: No Does the patient have difficulty doing errands alone such as visiting a doctor's office or shopping?: No    Past Medical History:  Past Medical History:  Diagnosis Date   Allergy    Vitamin  D deficiency     Surgical History:  Past Surgical History:  Procedure Laterality Date   COLONOSCOPY WITH PROPOFOL N/A 08/11/2017   Procedure: COLONOSCOPY WITH PROPOFOL;  Surgeon: Pasty Spillers, MD;  Location: [facility] ENDOSCOPY;  Service: Gastroenterology;  Laterality: N/A;   TUBAL LIGATION     WRIST SURGERY Left     Medications:  Current Outpatient Medications on File Prior to Visit  Medication Sig   Acetaminophen (TYLENOL PO) Take by mouth daily.   fluticasone (FLONASE) 50 MCG/ACT nasal spray Place 1 spray into both nostrils 2 (two) times a day.   Loratadine (CLARITIN PO) Take by mouth daily.   VITAMIN D, CHOLECALCIFEROL, PO Take by mouth daily.   No current facility-administered medications on file prior to visit.    Allergies:  No Known Allergies  Social History:  Social  History   Socioeconomic History   Marital status: Married    Spouse name: Not on file   Number of children: Not on file   Years of education: Not on file   Highest education level: Not on file  Occupational History   Not on file  Tobacco Use   Smoking status: Never   Smokeless tobacco: Never  Vaping Use   Vaping Use: Never used  Substance and Sexual Activity   Alcohol use: No    Alcohol/week: 0.0 standard drinks of alcohol   Drug use: No   Sexual activity: Yes    Birth control/protection: Surgical  Other Topics Concern   Not on file  Social History Narrative   Not on file   Social Determinants of Health   Financial Resource Strain: Not on file  Food Insecurity: Not on file  Transportation Needs: Not on file  Physical Activity: Not on file  Stress: Not on file  Social Connections: Not on file  Intimate Partner Violence: Not on file   Social History   Tobacco Use  Smoking Status Never  Smokeless Tobacco Never   Social History   Substance and Sexual Activity  Alcohol Use No   Alcohol/week: 0.0 standard drinks of alcohol    Family History:  Family History  Problem Relation Age of Onset   Hypertension Mother    Cancer Mother        skin   Heart disease Father    Breast cancer Neg Hx     Past medical history, surgical history, medications, allergies, family history and social history reviewed with patient today and changes made to appropriate areas of the chart.   ROS All other ROS negative except what is listed above and in the HPI.      Objective:    BP 130/79   Pulse 61   Temp 97.9 F (36.6 C) (Oral)   Ht 5' 5.25" (1.657 m)   Wt 190 lb 6.4 oz (86.4 kg)   LMP  (LMP Unknown)   SpO2 97%   BMI 31.44 kg/m   Wt Readings from Last 3 Encounters:  08/20/21 190 lb 6.4 oz (86.4 kg)  08/17/20 194 lb 6.4 oz (88.2 kg)  08/15/19 194 lb (88 kg)    Physical Exam Vitals and nursing note reviewed. Exam conducted with a chaperone present.  Constitutional:       General: She is awake. She is not in acute distress.    Appearance: She is well-developed. She is not ill-appearing.  HENT:     Head: Normocephalic and atraumatic.     Right Ear: Hearing, ear canal and external ear normal. No drainage.  A middle ear effusion is present.     Left Ear: Hearing, ear canal and external ear normal. No drainage. A middle ear effusion is present.     Nose: Nose normal.     Right Turbinates: Swollen.     Left Turbinates: Swollen.     Right Sinus: No maxillary sinus tenderness or frontal sinus tenderness.     Left Sinus: No maxillary sinus tenderness or frontal sinus tenderness.     Mouth/Throat:     Mouth: Mucous membranes are moist.     Pharynx: Oropharynx is clear. Uvula midline. Posterior oropharyngeal erythema (with cobblestone appearance) present. No pharyngeal swelling or oropharyngeal exudate.  Eyes:     General: Lids are normal.        Right eye: No discharge.        Left eye: No discharge.     Extraocular Movements: Extraocular movements intact.     Conjunctiva/sclera: Conjunctivae normal.     Pupils: Pupils are equal, round, and reactive to light.     Visual Fields: Right eye visual fields normal and left eye visual fields normal.  Neck:     Thyroid: No thyromegaly.     Vascular: No carotid bruit.     Trachea: Trachea normal.  Cardiovascular:     Rate and Rhythm: Normal rate and regular rhythm.     Heart sounds: Normal heart sounds. No murmur heard.    No gallop.  Pulmonary:     Effort: Pulmonary effort is normal. No accessory muscle usage or respiratory distress.     Breath sounds: Normal breath sounds.  Chest:  Breasts:    Right: Normal.     Left: Normal.  Abdominal:     General: Bowel sounds are normal.     Palpations: Abdomen is soft. There is no hepatomegaly or splenomegaly.     Tenderness: There is no abdominal tenderness.     Hernia: There is no hernia in the left inguinal area or right inguinal area.  Genitourinary:    Exam  position: Lithotomy position.     Labia:        Right: No rash.        Left: No rash.      Vagina: Erythema present. No tenderness.     Cervix: Normal.     Uterus: Normal.      Adnexa: Right adnexa normal and left adnexa normal.     Comments: Cervix anterior and viewed, pap obtained.  Moderate erythema to vaginal walls. Musculoskeletal:        General: Normal range of motion.     Cervical back: Normal range of motion and neck supple.     Right lower leg: No edema.     Left lower leg: No edema.  Lymphadenopathy:     Head:     Right side of head: No submental, submandibular, tonsillar, preauricular or posterior auricular adenopathy.     Left side of head: No submental, submandibular, tonsillar, preauricular or posterior auricular adenopathy.     Cervical: No cervical adenopathy.     Upper Body:     Right upper body: No supraclavicular, axillary or pectoral adenopathy.     Left upper body: No supraclavicular, axillary or pectoral adenopathy.  Skin:    General: Skin is warm and dry.     Capillary Refill: Capillary refill takes less than 2 seconds.     Findings: No rash.  Neurological:     Mental Status: She is alert and oriented to person, place,  and time.     Gait: Gait is intact.     Deep Tendon Reflexes: Reflexes are normal and symmetric.     Reflex Scores:      Brachioradialis reflexes are 2+ on the right side and 2+ on the left side.      Patellar reflexes are 2+ on the right side and 2+ on the left side. Psychiatric:        Attention and Perception: Attention normal.        Mood and Affect: Mood normal.        Speech: Speech normal.        Behavior: Behavior normal. Behavior is cooperative.        Thought Content: Thought content normal.        Judgment: Judgment normal.     Results for orders placed or performed in visit on 08/17/20  Microscopic Examination   Urine  Result Value Ref Range   WBC, UA None seen 0 - 5 /hpf   RBC None seen 0 - 2 /hpf   Epithelial Cells  (non renal) 0-10 0 - 10 /hpf   Mucus, UA Present (A) Not Estab.   Bacteria, UA None seen None seen/Few  CBC with Differential/Platelet  Result Value Ref Range   WBC 7.2 3.4 - 10.8 x10E3/uL   RBC 4.78 3.77 - 5.28 x10E6/uL   Hemoglobin 14.5 11.1 - 15.9 g/dL   Hematocrit 40.9 81.1 - 46.6 %   MCV 90 79 - 97 fL   MCH 30.3 26.6 - 33.0 pg   MCHC 33.7 31.5 - 35.7 g/dL   RDW 91.4 78.2 - 95.6 %   Platelets 244 150 - 450 x10E3/uL   Neutrophils 62 Not Estab. %   Lymphs 31 Not Estab. %   Monocytes 5 Not Estab. %   Eos 1 Not Estab. %   Basos 1 Not Estab. %   Neutrophils Absolute 4.4 1.4 - 7.0 x10E3/uL   Lymphocytes Absolute 2.2 0.7 - 3.1 x10E3/uL   Monocytes Absolute 0.4 0.1 - 0.9 x10E3/uL   EOS (ABSOLUTE) 0.1 0.0 - 0.4 x10E3/uL   Basophils Absolute 0.1 0.0 - 0.2 x10E3/uL   Immature Granulocytes 0 Not Estab. %   Immature Grans (Abs) 0.0 0.0 - 0.1 x10E3/uL  Comprehensive metabolic panel  Result Value Ref Range   Glucose 84 65 - 99 mg/dL   BUN 12 6 - 24 mg/dL   Creatinine, Ser 2.13 0.57 - 1.00 mg/dL   eGFR 86 >08 MV/HQI/6.96   BUN/Creatinine Ratio 15 9 - 23   Sodium 142 134 - 144 mmol/L   Potassium 4.2 3.5 - 5.2 mmol/L   Chloride 102 96 - 106 mmol/L   CO2 23 20 - 29 mmol/L   Calcium 9.4 8.7 - 10.2 mg/dL   Total Protein 6.4 6.0 - 8.5 g/dL   Albumin 4.6 3.8 - 4.9 g/dL   Globulin, Total 1.8 1.5 - 4.5 g/dL   Albumin/Globulin Ratio 2.6 (H) 1.2 - 2.2   Bilirubin Total 0.3 0.0 - 1.2 mg/dL   Alkaline Phosphatase 98 44 - 121 IU/L   AST 19 0 - 40 IU/L   ALT 14 0 - 32 IU/L  Lipid Panel w/o Chol/HDL Ratio  Result Value Ref Range   Cholesterol, Total 208 (H) 100 - 199 mg/dL   Triglycerides 295 (H) 0 - 149 mg/dL   HDL 47 >28 mg/dL   VLDL Cholesterol Cal 39 5 - 40 mg/dL   LDL Chol Calc (NIH) 413 (H) 0 -  99 mg/dL  Urinalysis, Routine w reflex microscopic  Result Value Ref Range   Specific Gravity, UA 1.020 1.005 - 1.030   pH, UA 5.0 5.0 - 7.5   Color, UA Yellow Yellow   Appearance Ur Clear  Clear   Leukocytes,UA Trace (A) Negative   Protein,UA Negative Negative/Trace   Glucose, UA Negative Negative   Ketones, UA Negative Negative   RBC, UA Negative Negative   Bilirubin, UA Negative Negative   Urobilinogen, Ur 0.2 0.2 - 1.0 mg/dL   Nitrite, UA Negative Negative   Microscopic Examination See below:   TSH  Result Value Ref Range   TSH 1.280 0.450 - 4.500 uIU/mL  VITAMIN D 25 Hydroxy (Vit-D Deficiency, Fractures)  Result Value Ref Range   Vit D, 25-Hydroxy 29.0 (L) 30.0 - 100.0 ng/mL      Assessment & Plan:   Problem List Items Addressed This Visit       Respiratory   Allergic rhinitis    Chronic, ongoing issue.  Has tried all OTC medications without benefit.  Trial Singulair, educated patient on this medication at length.  Discussed BLACK BOX warning with her.  Return in 8 weeks for follow-up, may benefit from allergist referral if ongoing.      Relevant Orders   CBC with Differential/Platelet     Genitourinary   Vaginal atrophy    Noted on pap, educated her on this and recommend use of lubrication during sexual intercourse.  Could consider estrace gel in future if needed.        Other   Elevated low density lipoprotein (LDL) cholesterol level - Primary    Noted on past labs, ASCVD 3.9%. Continue diet focus and recheck labs today.      Relevant Orders   Comprehensive metabolic panel   Lipid Panel w/o Chol/HDL Ratio   Vitamin D deficiency    Chronic, ongoing taking supplement.  Recheck level today.      Relevant Orders   VITAMIN D 25 Hydroxy (Vit-D Deficiency, Fractures)   Other Visit Diagnoses     Cervical cancer screening       Pap smear performed today.   Relevant Orders   Cytology - PAP   Encounter for screening mammogram for malignant neoplasm of breast       Mammogram ordered today.   Relevant Orders   MM 3D SCREEN BREAST BILATERAL   Encounter for annual physical exam       Annual physical with labs and health maintenance reviewed with  patient.   Relevant Orders   CBC with Differential/Platelet   TSH        Follow up plan: Return in about 8 weeks (around 10/15/2021) for Allergic rhinitis -- added Singulair.   LABORATORY TESTING:  - Pap smear: pap done  IMMUNIZATIONS:   - Tdap: Tetanus vaccination status reviewed: last tetanus booster within 10 years. - Influenza: Refused - Pneumovax: Not applicable - Prevnar: Not applicable - COVID: Refused - HPV: Not applicable - Shingrix vaccine: Refused  SCREENING: -Mammogram: Ordered today  - Colonoscopy: Up to date  - Bone Density: Not applicable  -Hearing Test: Not applicable  -Spirometry: Not applicable   PATIENT COUNSELING:   Advised to take 1 mg of folate supplement per day if capable of pregnancy.   Sexuality: Discussed sexually transmitted diseases, partner selection, use of condoms, avoidance of unintended pregnancy  and contraceptive alternatives.   Advised to avoid cigarette smoking.  I discussed with the patient that most people either abstain from alcohol  or drink within safe limits (<=14/week and <=4 drinks/occasion for males, <=7/weeks and <= 3 drinks/occasion for females) and that the risk for alcohol disorders and other health effects rises proportionally with the number of drinks per week and how often a drinker exceeds daily limits.  Discussed cessation/primary prevention of drug use and availability of treatment for abuse.   Diet: Encouraged to adjust caloric intake to maintain  or achieve ideal body weight, to reduce intake of dietary saturated fat and total fat, to limit sodium intake by avoiding high sodium foods and not adding table salt, and to maintain adequate dietary potassium and calcium preferably from fresh fruits, vegetables, and low-fat dairy products.    Stressed the importance of regular exercise  Injury prevention: Discussed safety belts, safety helmets, smoke detector, smoking near bedding or upholstery.   Dental health:  Discussed importance of regular tooth brushing, flossing, and dental visits.    NEXT PREVENTATIVE PHYSICAL DUE IN 1 YEAR. Return in about 8 weeks (around 10/15/2021) for Allergic rhinitis -- added Singulair.

## 2021-08-21 LAB — CBC WITH DIFFERENTIAL/PLATELET
Basophils Absolute: 0.1 10*3/uL (ref 0.0–0.2)
Basos: 1 %
EOS (ABSOLUTE): 0.1 10*3/uL (ref 0.0–0.4)
Eos: 2 %
Hematocrit: 41.6 % (ref 34.0–46.6)
Hemoglobin: 14.3 g/dL (ref 11.1–15.9)
Immature Grans (Abs): 0 10*3/uL (ref 0.0–0.1)
Immature Granulocytes: 0 %
Lymphocytes Absolute: 2.2 10*3/uL (ref 0.7–3.1)
Lymphs: 34 %
MCH: 30.7 pg (ref 26.6–33.0)
MCHC: 34.4 g/dL (ref 31.5–35.7)
MCV: 89 fL (ref 79–97)
Monocytes Absolute: 0.4 10*3/uL (ref 0.1–0.9)
Monocytes: 6 %
Neutrophils Absolute: 3.7 10*3/uL (ref 1.4–7.0)
Neutrophils: 57 %
Platelets: 251 10*3/uL (ref 150–450)
RBC: 4.66 x10E6/uL (ref 3.77–5.28)
RDW: 13 % (ref 11.7–15.4)
WBC: 6.4 10*3/uL (ref 3.4–10.8)

## 2021-08-21 LAB — COMPREHENSIVE METABOLIC PANEL
ALT: 32 IU/L (ref 0–32)
AST: 30 IU/L (ref 0–40)
Albumin/Globulin Ratio: 2 (ref 1.2–2.2)
Albumin: 4.7 g/dL (ref 3.8–4.9)
Alkaline Phosphatase: 89 IU/L (ref 44–121)
BUN/Creatinine Ratio: 14 (ref 12–28)
BUN: 11 mg/dL (ref 8–27)
Bilirubin Total: 0.2 mg/dL (ref 0.0–1.2)
CO2: 22 mmol/L (ref 20–29)
Calcium: 9.6 mg/dL (ref 8.7–10.3)
Chloride: 103 mmol/L (ref 96–106)
Creatinine, Ser: 0.76 mg/dL (ref 0.57–1.00)
Globulin, Total: 2.4 g/dL (ref 1.5–4.5)
Glucose: 93 mg/dL (ref 70–99)
Potassium: 4.1 mmol/L (ref 3.5–5.2)
Sodium: 141 mmol/L (ref 134–144)
Total Protein: 7.1 g/dL (ref 6.0–8.5)
eGFR: 90 mL/min/{1.73_m2} (ref >59)

## 2021-08-21 LAB — TSH: TSH: 1.67 u[IU]/mL (ref 0.450–4.500)

## 2021-08-21 LAB — LIPID PANEL W/O CHOL/HDL RATIO
Cholesterol, Total: 248 mg/dL — ABNORMAL HIGH (ref 100–199)
HDL: 50 mg/dL (ref >39)
LDL Chol Calc (NIH): 152 mg/dL — ABNORMAL HIGH (ref 0–99)
Triglycerides: 251 mg/dL — ABNORMAL HIGH (ref 0–149)
VLDL Cholesterol Cal: 46 mg/dL — ABNORMAL HIGH (ref 5–40)

## 2021-08-21 LAB — VITAMIN D 25 HYDROXY (VIT D DEFICIENCY, FRACTURES): Vit D, 25-Hydroxy: 27.3 ng/mL — ABNORMAL LOW (ref 30.0–100.0)

## 2021-08-24 LAB — CYTOLOGY - PAP
Adequacy: ABSENT
Comment: NEGATIVE
Diagnosis: NEGATIVE
High risk HPV: NEGATIVE

## 2021-09-02 ENCOUNTER — Telehealth: Payer: Self-pay

## 2021-09-02 NOTE — Telephone Encounter (Signed)
Pt scheduled 09/07/2021 with Henrine Screws

## 2021-09-02 NOTE — Telephone Encounter (Signed)
Copied from [place] [id number]. Topic: General - Other >> Sep 01, 2021  2:28 PM Chapman Fitch wrote: Reason for CRM: Pt tried to schedule a 3D mammogram and [facility] in [place] needs pcp to call and order mammogram for the pt /please call pt when scheduled or placed / please advise   Order for mammogram is in the system so I reached out to [employer] at [facility]. Per Roselyn Reef, the patient called to schedule her mammogram but reported to them that she has a breast lump on her left breast with pain. I relayed this information to [facility] and she states that the patient would need an evaluation in office to document and place orders.   Please call patient and schedule an appointment.

## 2021-09-02 NOTE — Telephone Encounter (Signed)
Noted  

## 2021-09-05 NOTE — Patient Instructions (Signed)
Breast Tenderness Breast tenderness is a common problem for women of all ages, but may also occur in men. Breast tenderness may range from mild discomfort to severe pain. In women, the pain usually comes and goes with the menstrual cycle, but it can also be constant. Breast tenderness has many possible causes, including hormone changes, infections, and taking certain medicines. You may have tests, such as a mammogram or an ultrasound, to check for any unusual findings. Having breast tenderness usually does not mean that you have breast cancer. Follow these instructions at home: Managing pain and discomfort  If directed, put ice to the painful area. To do this: Put ice in a plastic bag. Place a towel between your skin and the bag. Leave the ice on for 20 minutes, 2-3 times a day. Wear a supportive bra, especially during exercise. You may also want to wear a supportive bra while sleeping if your breasts are very tender. Medicines Take over-the-counter and prescription medicines only as told by your health care provider. If the cause of your pain is infection, you may be prescribed an antibiotic medicine. If you were prescribed an antibiotic, take it as told by your health care provider. Do not stop taking the antibiotic even if you start to feel better. Eating and drinking Your health care provider may recommend that you lessen the amount of fat in your diet. You can do this by: Limiting fried foods. Cooking foods using methods such as baking, boiling, grilling, and broiling. Decrease the amount of caffeine in your diet. Instead, drink more water and choose caffeine-free drinks. General instructions  Keep a log of the days and times when your breasts are most tender. Ask your health care provider how to do breast exams at home. This will help you notice if you have an unusual growth or lump. Keep all follow-up visits as told by your health care provider. This is important. Contact a health care  provider if: Any part of your breast is hard, red, and hot to the touch. This may be a sign of infection. You are a woman and: Not breastfeeding and you have fluid, especially blood or pus, coming out of your nipples. Have a new or painful lump in your breast that remains after your menstrual period ends. You have a fever. Your pain does not improve or it gets worse. Your pain is interfering with your daily activities. Summary Breast tenderness may range from mild discomfort to severe pain. Breast tenderness has many possible causes, including hormone changes, infections, and taking certain medicines. It can be treated with ice, wearing a supportive bra, and medicines. Make changes to your diet if told to by your health care provider. This information is not intended to replace advice given to you by your health care provider. Make sure you discuss any questions you have with your health care provider. Document Revised: 07/09/2018 Document Reviewed: 07/09/2018 Elsevier Patient Education  2023 [employer]  

## 2021-09-07 ENCOUNTER — Encounter: Payer: Self-pay | Admitting: Nurse Practitioner

## 2021-09-07 ENCOUNTER — Ambulatory Visit: Payer: Commercial Managed Care - PPO | Admitting: Nurse Practitioner

## 2021-09-07 VITALS — BP 105/72 | HR 65 | Temp 98.2°F | Wt 191.8 lb

## 2021-09-07 DIAGNOSIS — N6002 Solitary cyst of left breast: Secondary | ICD-10-CM | POA: Insufficient documentation

## 2021-09-07 NOTE — Progress Notes (Signed)
BP 105/72   Pulse 65   Temp 98.2 F (36.8 C) (Oral)   Wt 191 lb 12.8 oz (87 kg)   LMP  (LMP Unknown)   SpO2 92%   BMI 31.67 kg/m    Subjective:    Patient ID: Jeanette Henry, female    DOB: 1961/01/09, 61 y.o.   MRN: [id number]  HPI: Jeanette Henry is a 61 y.o. female  Chief Complaint  Patient presents with   Breast Problem   BREAST MASS To left breast -- has been there for some time, is having mammograms. Sore at times, knows it is there.   Duration :months Location: left Onset: gradual Redness: no Swelling: no Trauma: no trauma Breastfeeding: no Associated with menstral cycle: no Nipple discharge: no Breast lump: yes Status: stable Treatments attempted: none Previous mammogram: yes   Relevant past medical, surgical, family and social history reviewed and updated as indicated. Interim medical history since our last visit reviewed. Allergies and medications reviewed and updated.  Review of Systems  Constitutional:  Negative for activity change, appetite change, diaphoresis, fatigue and fever.  Respiratory:  Negative for cough, chest tightness and shortness of breath.   Cardiovascular:  Negative for chest pain, palpitations and leg swelling.  Gastrointestinal: Negative.   Neurological: Negative.   Psychiatric/Behavioral: Negative.      Per HPI unless specifically indicated above     Objective:    BP 105/72   Pulse 65   Temp 98.2 F (36.8 C) (Oral)   Wt 191 lb 12.8 oz (87 kg)   LMP  (LMP Unknown)   SpO2 92%   BMI 31.67 kg/m   Wt Readings from Last 3 Encounters:  09/07/21 191 lb 12.8 oz (87 kg)  08/20/21 190 lb 6.4 oz (86.4 kg)  08/17/20 194 lb 6.4 oz (88.2 kg)    Physical Exam Vitals and nursing note reviewed.  Constitutional:      General: She is awake. She is not in acute distress.    Appearance: She is well-developed and well-groomed. She is obese. She is not ill-appearing or toxic-appearing.  HENT:     Head: Normocephalic.     Right  Ear: Hearing normal.     Left Ear: Hearing normal.  Eyes:     General: Lids are normal.        Right eye: No discharge.        Left eye: No discharge.     Conjunctiva/sclera: Conjunctivae normal.     Pupils: Pupils are equal, round, and reactive to light.  Neck:     Thyroid: No thyromegaly.     Vascular: No carotid bruit.  Cardiovascular:     Rate and Rhythm: Normal rate and regular rhythm.     Heart sounds: Normal heart sounds. No murmur heard.    No gallop.  Pulmonary:     Effort: Pulmonary effort is normal.     Breath sounds: Normal breath sounds.  Chest:  Breasts:    Right: Normal.     Left: Mass present. No swelling, bleeding, nipple discharge, skin change or tenderness.     Comments: Small cystic like mass to 10 o'clock aspect midline Abdominal:     General: Bowel sounds are normal.     Palpations: Abdomen is soft.  Musculoskeletal:     Cervical back: Normal range of motion and neck supple.     Right lower leg: No edema.     Left lower leg: No edema.  Lymphadenopathy:  Cervical: No cervical adenopathy.     Upper Body:     Right upper body: No supraclavicular, axillary or pectoral adenopathy.     Left upper body: No supraclavicular, axillary or pectoral adenopathy.  Skin:    General: Skin is warm and dry.  Neurological:     Mental Status: She is alert and oriented to person, place, and time.  Psychiatric:        Attention and Perception: Attention normal.        Mood and Affect: Mood normal.        Speech: Speech normal.        Behavior: Behavior normal. Behavior is cooperative.        Thought Content: Thought content normal.     Results for orders placed or performed in visit on 08/20/21  CBC with Differential/Platelet  Result Value Ref Range   WBC 6.4 3.4 - 10.8 x10E3/uL   RBC 4.66 3.77 - 5.28 x10E6/uL   Hemoglobin 14.3 11.1 - 15.9 g/dL   Hematocrit 41.6 34.0 - 46.6 %   MCV 89 79 - 97 fL   MCH 30.7 26.6 - 33.0 pg   MCHC 34.4 31.5 - 35.7 g/dL   RDW  13.0 11.7 - 15.4 %   Platelets 251 150 - 450 x10E3/uL   Neutrophils 57 Not Estab. %   Lymphs 34 Not Estab. %   Monocytes 6 Not Estab. %   Eos 2 Not Estab. %   Basos 1 Not Estab. %   Neutrophils Absolute 3.7 1.4 - 7.0 x10E3/uL   Lymphocytes Absolute 2.2 0.7 - 3.1 x10E3/uL   Monocytes Absolute 0.4 0.1 - 0.9 x10E3/uL   EOS (ABSOLUTE) 0.1 0.0 - 0.4 x10E3/uL   Basophils Absolute 0.1 0.0 - 0.2 x10E3/uL   Immature Granulocytes 0 Not Estab. %   Immature Grans (Abs) 0.0 0.0 - 0.1 x10E3/uL  Comprehensive metabolic panel  Result Value Ref Range   Glucose 93 70 - 99 mg/dL   BUN 11 8 - 27 mg/dL   Creatinine, Ser 0.76 0.57 - 1.00 mg/dL   eGFR 90 >59 mL/min/1.73   BUN/Creatinine Ratio 14 12 - 28   Sodium 141 134 - 144 mmol/L   Potassium 4.1 3.5 - 5.2 mmol/L   Chloride 103 96 - 106 mmol/L   CO2 22 20 - 29 mmol/L   Calcium 9.6 8.7 - 10.3 mg/dL   Total Protein 7.1 6.0 - 8.5 g/dL   Albumin 4.7 3.8 - 4.9 g/dL   Globulin, Total 2.4 1.5 - 4.5 g/dL   Albumin/Globulin Ratio 2.0 1.2 - 2.2   Bilirubin Total 0.2 0.0 - 1.2 mg/dL   Alkaline Phosphatase 89 44 - 121 IU/L   AST 30 0 - 40 IU/L   ALT 32 0 - 32 IU/L  Lipid Panel w/o Chol/HDL Ratio  Result Value Ref Range   Cholesterol, Total 248 (H) 100 - 199 mg/dL   Triglycerides 251 (H) 0 - 149 mg/dL   HDL 50 >39 mg/dL   VLDL Cholesterol Cal 46 (H) 5 - 40 mg/dL   LDL Chol Calc (NIH) 152 (H) 0 - 99 mg/dL  TSH  Result Value Ref Range   TSH 1.670 0.450 - 4.500 uIU/mL  VITAMIN D 25 Hydroxy (Vit-D Deficiency, Fractures)  Result Value Ref Range   Vit D, 25-Hydroxy 27.3 (L) 30.0 - 100.0 ng/mL  Cytology - PAP  Result Value Ref Range   High risk HPV Negative    Adequacy      Satisfactory for  evaluation; transformation zone component ABSENT.   Diagnosis      - Negative for intraepithelial lesion or malignancy (NILM)   Comment Normal Reference Range HPV - Negative       Assessment & Plan:   Problem List Items Addressed This Visit       Other    Benign breast cyst in female, left - Primary    Known and on imaging last year -- diagnostic and ultrasound ordered per breast center request.  Discussed with patient, she will schedule.      Relevant Orders   MM Digital Diagnostic Bilat   US BREAST LTD UNI LEFT INC AXILLA     Follow up plan: Return if symptoms worsen or fail to improve.

## 2021-09-07 NOTE — Assessment & Plan Note (Signed)
Known and on imaging last year -- diagnostic and ultrasound ordered per breast center request.  Discussed with patient, she will schedule.

## 2021-09-24 ENCOUNTER — Ambulatory Visit
Admission: RE | Admit: 2021-09-24 | Discharge: 2021-09-24 | Disposition: A | Discharge status: Home / Self Care | Payer: Commercial Managed Care - PPO | Source: Ambulatory Visit | Attending: Nurse Practitioner | Admitting: Nurse Practitioner

## 2021-09-24 DIAGNOSIS — N6002 Solitary cyst of left breast: Secondary | ICD-10-CM | POA: Diagnosis present

## 2021-09-25 NOTE — Progress Notes (Signed)
Please let [name] know the area of concern continues to appear like a benign (non cancerous) skin cyst.  We can send to dermatology in future to remove if needed.  Let me know.  For now they recommend repeat mammogram in one year:)

## 2021-10-10 NOTE — Patient Instructions (Signed)
Allergic Rhinitis, Adult ?Allergic rhinitis is a reaction to allergens. Allergens are things that can cause an allergic reaction. This condition affects the lining inside the nose (mucous membrane). ?There are two types of allergic rhinitis: ?Seasonal. This type is also called hay fever. It happens only during some times of the year. ?Perennial. This type can happen at any time of the year. ?This condition cannot be spread from person to person (is not contagious). It can be mild, worse, or very bad. It can develop at any age and may be outgrown. ?What are the causes? ?This condition may be caused by: ?Pollen from grasses, trees, and weeds. ?Dust mites. ?Smoke. ?Mold. ?Car fumes. ?The pee (urine), spit, or dander of pets. Dander is dead skin cells from a pet. ?What increases the risk? ?You are more likely to develop this condition if: ?You have allergies in your family. ?You have problems like allergies in your family. You may have: ?Swelling of parts of your eyes and eyelids. ?Asthma. This affects how you breathe. ?Long-term redness and swelling on your skin. ?Food allergies. ?What are the signs or symptoms? ?The main symptom of this condition is a runny or stuffy nose (nasal congestion). Other symptoms may include: ?Sneezing or coughing. ?Itching and tearing of your eyes. ?Mucus that drips down the back of your throat (postnasal drip). ?Trouble sleeping. ?Feeling tired. ?Headache. ?Sore throat. ?How is this treated? ?There is no cure for this condition. You should avoid things that you are allergic to. Treatment can help to relieve symptoms. This may include: ?Medicines that block allergy symptoms, such as corticosteroids or antihistamines. These may be given as a shot, nasal spray, or pill. ?Avoiding things you are allergic to. ?Medicines that give you bits of what you are allergic to over time. This is called immunotherapy. It is done if other treatments do not help. You may get: ?Shots. ?Medicine under your  tongue. ?Stronger medicines, if other treatments do not help. ?Follow these instructions at home: ?Avoiding allergens ?Find out what things you are allergic to and avoid them. To do this, try these things: ?If you get allergies any time of year: ?Replace carpet with wood, tile, or vinyl flooring. Carpet can trap pet dander and dust. ?Do not smoke. Do not allow smoking in your home. ?Change your heating and air conditioning filters at least once a month. ?If you get allergies only some times of the year: ?Keep windows closed when you can. ?Plan things to do outside when pollen counts are lowest. Check pollen counts before you plan things to do outside. ?When you come indoors, change your clothes and shower before you sit on furniture or bedding. ?If you are allergic to a pet: ?Keep the pet out of your bedroom. ?Vacuum, sweep, and dust often. ? ?General instructions ?Take over-the-counter and prescription medicines only as told by your doctor. ?Drink enough fluid to keep your pee (urine) pale yellow. ?Keep all follow-up visits as told by your doctor. This is important. ?Where to find more information ?American Academy of Allergy, Asthma & Immunology: www.aaaai.org ?Contact a doctor if: ?You have a fever. ?You get a cough that does not go away. ?You make whistling sounds when you breathe (wheeze). ?Your symptoms slow you down. ?Your symptoms stop you from doing your normal things each day. ?Get help right away if: ?You are short of breath. ?This symptom may be an emergency. Do not wait to see if the symptom will go away. Get medical help right away. Call your local   emergency services (911 in the [place] Do not drive yourself to the hospital. ?Summary ?Allergic rhinitis may be treated by taking medicines and avoiding things you are allergic to. ?If you have allergies only some of the year, keep windows closed when you can at those times. ?Contact your doctor if you get a fever or a cough that does not go away. ?This  information is not intended to replace advice given to you by your health care provider. Make sure you discuss any questions you have with your health care provider. ?Document Revised: 04/08/2019 Document Reviewed: 02/12/2019 ?Elsevier Patient Education ? 2023 Elsevier Inc. ? ?

## 2021-10-15 ENCOUNTER — Encounter: Payer: Self-pay | Admitting: Nurse Practitioner

## 2021-10-15 ENCOUNTER — Ambulatory Visit: Payer: Commercial Managed Care - PPO | Admitting: Nurse Practitioner

## 2021-10-15 DIAGNOSIS — J301 Allergic rhinitis due to pollen: Secondary | ICD-10-CM

## 2021-10-15 NOTE — Progress Notes (Signed)
BP 124/77   Pulse (!) 59   Temp 98 F (36.7 C) (Oral)   Wt 190 lb 11.2 oz (86.5 kg)   LMP  (LMP Unknown)   SpO2 98%   BMI 31.49 kg/m    Subjective:    Patient ID: Jeanette Henry, female    DOB: 08/09/1960, 61 y.o.   MRN: [id number]  HPI: Jeanette Henry is a 61 y.o. female  Chief Complaint  Patient presents with   Allergic Rhinitis    ALLERGIES Started on Singulair last visit -- is helping somewhat, not taking Tylenol sinus as much.  Still has nose draining in morning and during day runs down throat.  Continues to use Flonase, occasionally forgets.  Works at [employer] -- constant exposure.  Did see ENT in past, several years ago -- > 20 years ago and they told her sinuses were okay.  No allergy testing was done. Duration: chronic Runny nose: yes "clear Nasal congestion:  occasional Nasal itching: no Sneezing:  occasional Eye swelling, itching or discharge: sometimes Post nasal drip: yes Cough:  when it starts draining heavily Sinus pressure: no  Ear pain: none Ear pressure: none Fever: none Symptoms occur seasonally: no Symptoms occur perenially: yes Satisfied with current treatment: yes Allergist evaluation in past: no Allergen injection immunotherapy: no Recurrent sinus infections:  occasional, will treat on own sometimes ENT evaluation in past: yes Known environmental allergy: no Indoor pets: no History of asthma: no Current allergy medications: Singulair, Tylenol Sinus,  Treatments attempted: Claritin, Tylenol Sinus, Sudafed, Mucinex, Singulair, Flonase   Relevant past medical, surgical, family and social history reviewed and updated as indicated. Interim medical history since our last visit reviewed. Allergies and medications reviewed and updated.  Review of Systems  Constitutional:  Negative for activity change, appetite change, diaphoresis, fatigue and fever.  Respiratory:  Negative for cough, chest tightness and shortness of breath.    Cardiovascular:  Negative for chest pain, palpitations and leg swelling.  Gastrointestinal: Negative.   Neurological: Negative.   Psychiatric/Behavioral: Negative.      Per HPI unless specifically indicated above     Objective:    BP 124/77   Pulse (!) 59   Temp 98 F (36.7 C) (Oral)   Wt 190 lb 11.2 oz (86.5 kg)   LMP  (LMP Unknown)   SpO2 98%   BMI 31.49 kg/m   Wt Readings from Last 3 Encounters:  10/15/21 190 lb 11.2 oz (86.5 kg)  09/07/21 191 lb 12.8 oz (87 kg)  08/20/21 190 lb 6.4 oz (86.4 kg)    Physical Exam Vitals and nursing note reviewed.  Constitutional:      General: She is awake. She is not in acute distress.    Appearance: She is well-developed and well-groomed. She is obese. She is not ill-appearing or toxic-appearing.  HENT:     Head: Normocephalic.     Right Ear: Hearing, tympanic membrane, ear canal and external ear normal.     Left Ear: Hearing, tympanic membrane, ear canal and external ear normal.     Nose: No rhinorrhea.     Right Sinus: No maxillary sinus tenderness or frontal sinus tenderness.     Left Sinus: No maxillary sinus tenderness or frontal sinus tenderness.     Mouth/Throat:     Mouth: Mucous membranes are moist.     Pharynx: Posterior oropharyngeal erythema (mild with cobblestone appearance) present. No pharyngeal swelling or oropharyngeal exudate.  Eyes:     General: Lids are normal.  Right eye: No discharge.        Left eye: No discharge.     Conjunctiva/sclera: Conjunctivae normal.     Pupils: Pupils are equal, round, and reactive to light.  Neck:     Thyroid: No thyromegaly.     Vascular: No carotid bruit.  Cardiovascular:     Rate and Rhythm: Normal rate and regular rhythm.     Heart sounds: Normal heart sounds. No murmur heard.    No gallop.  Pulmonary:     Effort: Pulmonary effort is normal.     Breath sounds: Normal breath sounds.  Abdominal:     General: Bowel sounds are normal.     Palpations: Abdomen is  soft.  Musculoskeletal:     Cervical back: Normal range of motion and neck supple.     Right lower leg: No edema.     Left lower leg: No edema.  Lymphadenopathy:     Cervical: No cervical adenopathy.  Skin:    General: Skin is warm and dry.  Neurological:     Mental Status: She is alert and oriented to person, place, and time.  Psychiatric:        Attention and Perception: Attention normal.        Mood and Affect: Mood normal.        Speech: Speech normal.        Behavior: Behavior normal. Behavior is cooperative.        Thought Content: Thought content normal.    Results for orders placed or performed in visit on 08/20/21  CBC with Differential/Platelet  Result Value Ref Range   WBC 6.4 3.4 - 10.8 x10E3/uL   RBC 4.66 3.77 - 5.28 x10E6/uL   Hemoglobin 14.3 11.1 - 15.9 g/dL   Hematocrit 41.6 34.0 - 46.6 %   MCV 89 79 - 97 fL   MCH 30.7 26.6 - 33.0 pg   MCHC 34.4 31.5 - 35.7 g/dL   RDW 13.0 11.7 - 15.4 %   Platelets 251 150 - 450 x10E3/uL   Neutrophils 57 Not Estab. %   Lymphs 34 Not Estab. %   Monocytes 6 Not Estab. %   Eos 2 Not Estab. %   Basos 1 Not Estab. %   Neutrophils Absolute 3.7 1.4 - 7.0 x10E3/uL   Lymphocytes Absolute 2.2 0.7 - 3.1 x10E3/uL   Monocytes Absolute 0.4 0.1 - 0.9 x10E3/uL   EOS (ABSOLUTE) 0.1 0.0 - 0.4 x10E3/uL   Basophils Absolute 0.1 0.0 - 0.2 x10E3/uL   Immature Granulocytes 0 Not Estab. %   Immature Grans (Abs) 0.0 0.0 - 0.1 x10E3/uL  Comprehensive metabolic panel  Result Value Ref Range   Glucose 93 70 - 99 mg/dL   BUN 11 8 - 27 mg/dL   Creatinine, Ser 0.76 0.57 - 1.00 mg/dL   eGFR 90 >59 mL/min/1.73   BUN/Creatinine Ratio 14 12 - 28   Sodium 141 134 - 144 mmol/L   Potassium 4.1 3.5 - 5.2 mmol/L   Chloride 103 96 - 106 mmol/L   CO2 22 20 - 29 mmol/L   Calcium 9.6 8.7 - 10.3 mg/dL   Total Protein 7.1 6.0 - 8.5 g/dL   Albumin 4.7 3.8 - 4.9 g/dL   Globulin, Total 2.4 1.5 - 4.5 g/dL   Albumin/Globulin Ratio 2.0 1.2 - 2.2   Bilirubin  Total 0.2 0.0 - 1.2 mg/dL   Alkaline Phosphatase 89 44 - 121 IU/L   AST 30 0 - 40 IU/L   ALT  32 0 - 32 IU/L  Lipid Panel w/o Chol/HDL Ratio  Result Value Ref Range   Cholesterol, Total 248 (H) 100 - 199 mg/dL   Triglycerides 251 (H) 0 - 149 mg/dL   HDL 50 >39 mg/dL   VLDL Cholesterol Cal 46 (H) 5 - 40 mg/dL   LDL Chol Calc (NIH) 152 (H) 0 - 99 mg/dL  TSH  Result Value Ref Range   TSH 1.670 0.450 - 4.500 uIU/mL  VITAMIN D 25 Hydroxy (Vit-D Deficiency, Fractures)  Result Value Ref Range   Vit D, 25-Hydroxy 27.3 (L) 30.0 - 100.0 ng/mL  Cytology - PAP  Result Value Ref Range   High risk HPV Negative    Adequacy      Satisfactory for evaluation; transformation zone component ABSENT.   Diagnosis      - Negative for intraepithelial lesion or malignancy (NILM)   Comment Normal Reference Range HPV - Negative       Assessment & Plan:   Problem List Items Addressed This Visit       Respiratory   Allergic rhinitis    Chronic, ongoing issue.  Has tried all OTC medications without benefit.  At this time will continue Singulair which is offering some benefit + continue Flonase and Claritin.  Discussed BLACK BOX warning with her.  Could consider referral to allergist if ongoing issues with allergies, has seen ENT in past with assessment - but no allergy testing.        Follow up plan: Return in about 10 months (around 08/22/2022) for Annual Physical after 08/21/22.

## 2021-10-15 NOTE — Assessment & Plan Note (Signed)
Chronic, ongoing issue.  Has tried all OTC medications without benefit.  At this time will continue Singulair which is offering some benefit + continue Flonase and Claritin.  Discussed BLACK BOX warning with her.  Could consider referral to allergist if ongoing issues with allergies, has seen ENT in past with assessment - but no allergy testing.

## 2022-01-07 ENCOUNTER — Ambulatory Visit: Payer: Commercial Managed Care - PPO | Admitting: Physician Assistant

## 2022-01-07 ENCOUNTER — Ambulatory Visit: Payer: Self-pay

## 2022-01-07 ENCOUNTER — Encounter: Payer: Self-pay | Admitting: Physician Assistant

## 2022-01-07 VITALS — BP 128/84 | HR 60 | Temp 97.8°F | Ht 65.24 in | Wt 189.5 lb

## 2022-01-07 DIAGNOSIS — R112 Nausea with vomiting, unspecified: Secondary | ICD-10-CM | POA: Diagnosis not present

## 2022-01-07 DIAGNOSIS — R42 Dizziness and giddiness: Secondary | ICD-10-CM

## 2022-01-07 MED ORDER — ONDANSETRON HCL 4 MG PO TABS: 4.0000 mg | ORAL_TABLET | Freq: Three times a day (TID) | ORAL | 0 refills | Status: DC | PRN

## 2022-01-07 MED ORDER — MECLIZINE HCL 12.5 MG PO TABS: 12.5000 mg | ORAL_TABLET | Freq: Three times a day (TID) | ORAL | 0 refills | Status: DC | PRN

## 2022-01-07 NOTE — Progress Notes (Signed)
Acute Office Visit   Patient: Jeanette Henry   DOB: 06-09-1960   61 y.o. Female  MRN: [id number] Visit Date: 01/07/2022  Today's healthcare provider: Dani Gobble Markee Matera, PA-C  Introduced myself to the patient as a Journalist, newspaper and provided education on APPs in clinical practice.    Chief Complaint  Patient presents with   Headache    Nausea, dizziness started last night.    Subjective    HPI HPI     Headache    Additional comments: Nausea, dizziness started last night.       Last edited by Jerelene Redden, CMA on 01/07/2022 10:48 AM.      Reports she is not feeling well States her main concern is the dizziness She has felt nauseous and vomited several times Reports she has not had anything to drink since last night She has vomited about 4 times since last night Reports nausea as soon as she tries to take anything PO  Reports nausea is exacerbated by dizziness She states she has had dizziness like this before once when she was sick Alleviating: cool rags over head  Aggravating: bending over or fast movements  Interventions: cool rags over her head       Medications: Outpatient Medications Prior to Visit  Medication Sig   Acetaminophen (TYLENOL PO) Take by mouth daily.   fluticasone (FLONASE) 50 MCG/ACT nasal spray Place 1 spray into both nostrils 2 (two) times a day.   montelukast (SINGULAIR) 10 MG tablet Take 1 tablet (10 mg total) by mouth at bedtime.   VITAMIN D, CHOLECALCIFEROL, PO Take 2,000 Units by mouth daily.   No facility-administered medications prior to visit.    Review of Systems  Constitutional:  Negative for chills and fever.  HENT:  Negative for congestion, ear pain, rhinorrhea, sinus pressure, sinus pain, sneezing and sore throat.   Eyes:  Negative for discharge, itching and visual disturbance.  Respiratory:  Negative for cough.   Gastrointestinal:  Positive for nausea and vomiting (Denies hematemesis). Negative for constipation and  diarrhea.  Musculoskeletal:  Negative for neck pain and neck stiffness.  Neurological:  Positive for dizziness and headaches. Negative for syncope and weakness.       Objective    BP 128/84   Pulse 60   Temp 97.8 F (36.6 C) (Oral)   Ht 5' 5.24" (1.657 m)   Wt 189 lb 8 oz (86 kg)   LMP  (LMP Unknown)   SpO2 99%   BMI 31.31 kg/m    Physical Exam Vitals reviewed.  Constitutional:      General: She is awake.     Appearance: Normal appearance. She is well-developed and well-groomed.  HENT:     Head: Normocephalic and atraumatic.     Right Ear: Tympanic membrane, ear canal and external ear normal.     Left Ear: Tympanic membrane, ear canal and external ear normal.     Mouth/Throat:     Lips: Pink.     Pharynx: Oropharynx is clear. Uvula midline. No pharyngeal swelling, oropharyngeal exudate, posterior oropharyngeal erythema or uvula swelling.  Eyes:     General: Lids are normal. Gaze aligned appropriately.     Extraocular Movements: Extraocular movements intact.     Right eye: Normal extraocular motion and no nystagmus.     Left eye: Normal extraocular motion and no nystagmus.     Conjunctiva/sclera: Conjunctivae normal.     Pupils: Pupils are equal, round,  and reactive to light.  Pulmonary:     Effort: Pulmonary effort is normal.  Musculoskeletal:     Cervical back: Normal range of motion.  Neurological:     General: No focal deficit present.     Mental Status: She is alert and oriented to person, place, and time.     Cranial Nerves: No dysarthria or facial asymmetry.     Motor: Motor function is intact. No weakness or tremor.  Psychiatric:        Attention and Perception: Attention and perception normal.        Mood and Affect: Mood and affect normal.        Speech: Speech normal.        Behavior: Behavior normal. Behavior is cooperative.       No results found for any visits on 01/07/22.  Assessment & Plan      No follow-ups on file.      Problem List  Items Addressed This Visit       Digestive   Nausea and vomiting    Acute, new concern Likely secondary to dizziness Will provide Zofran to assist with nausea and improve PO intake  Recommend bland diet until nausea is subsiding  Follow up as needed for persistent or progressing symptoms      Relevant Medications   ondansetron (ZOFRAN) 4 MG tablet     Other   Dizziness - Primary    Acute, new concern Reports dizziness that is exacerbated by bending over or fast movements Suspect BPPV - will provide instructions for Epley maneuver and Meclizine 12.5 mg PO TID PRN to assist with dizziness Recommend she continue to use Flonase and Singulair to assist with congestion  If not improving with these measures, may need referral to ENT for evaluation  Follow up as needed for persistent or progressing symptoms       Relevant Medications   meclizine (ANTIVERT) 12.5 MG tablet     No follow-ups on file.   I, Keevan Wolz E Jenise Iannelli, PA-C, have reviewed all documentation for this visit. The documentation on 01/07/22 for the exam, diagnosis, procedures, and orders are all accurate and complete.   Talitha Givens, MHS, PA-C [facility]

## 2022-01-07 NOTE — Assessment & Plan Note (Signed)
Acute, new concern Reports dizziness that is exacerbated by bending over or fast movements Suspect BPPV - will provide instructions for Epley maneuver and Meclizine 12.5 mg PO TID PRN to assist with dizziness Recommend she continue to use Flonase and Singulair to assist with congestion  If not improving with these measures, may need referral to ENT for evaluation  Follow up as needed for persistent or progressing symptoms

## 2022-01-07 NOTE — Assessment & Plan Note (Signed)
Acute, new concern Likely secondary to dizziness Will provide Zofran to assist with nausea and improve PO intake  Recommend bland diet until nausea is subsiding  Follow up as needed for persistent or progressing symptoms

## 2022-01-07 NOTE — Patient Instructions (Addendum)
I recommend the following at this time to assist with your dizziness: Making position changes slowly and safely to prevent further dizziness and lightheadedness.  I am sending in a script for Meclizine to help with your dizziness I am sending in a script for Zofran to help with your nausea and vomiting Please try to consume a bland diet until you are feeling better and then you can slowly re-introduce your normal foods Make sure you are eating small meals and snacks to help prevent more nausea  If you feel like you are not getting better or the dizziness is getting worse over the next 2-3 weeks please let us know and we can send you to ENT for them to evaluate you  It was nice to meet you and I appreciate the opportunity to be involved in your care If you were satisfied with the care you received from me, I would greatly appreciate you saying so in the after-visit survey that is sent out following our visit.

## 2022-01-07 NOTE — Telephone Encounter (Signed)
Message from Luciana Axe sent at 01/07/2022  8:00 AM EST  Summary: Dizziness and nausea advice   Pt is calling to report dizziness and nausea since yesterday. Please advise         Chief Complaint: dizziness Symptoms: nausea, headache Frequency: yesterday Pertinent Negatives: Patient denies fever, chest pain, vomiting, diarrhea, bleeding Disposition: '[]'$ ED /'[]'$ Urgent Care (no appt availability in office) / '[x]'$ Appointment(In office/virtual)/ '[]'$  [facility]/ '[]'$ Home Care/ '[]'$ Refused Recommended Disposition /'[]'$ [place] [facility]/ '[]'$  Follow-up with PCP Additional Notes: no appts available with PCP.  Reason for Disposition  [1] MODERATE dizziness (e.g., interferes with normal activities) AND [2] has NOT been evaluated by doctor (or NP/PA) for this  (Exception: Dizziness caused by heat exposure, sudden standing, or poor fluid intake.)  Answer Assessment - Initial Assessment Questions 1. DESCRIPTION: "Describe your dizziness."     lightheadedness 2. LIGHTHEADED: "Do you feel lightheaded?" (e.g., somewhat faint, woozy, weak upon standing)     Not at time 3. VERTIGO: "Do you feel like either you or the room is spinning or tilting?" (i.e. vertigo)     Vertigo  4. SEVERITY: "How bad is it?"  "Do you feel like you are going to faint?" "Can you stand and walk?"   - MILD: Feels slightly dizzy, but walking normally.   - MODERATE: Feels unsteady when walking, but not falling; interferes with normal activities (e.g., school, work).   - SEVERE: Unable to walk without falling, or requires assistance to walk without falling; feels like passing out now.      Moderate- off balance  5. ONSET:  "When did the dizziness begin?"     yesterday 6. AGGRAVATING FACTORS: "Does anything make it worse?" (e.g., standing, change in head position)     Changing head position 7. HEART RATE: "Can you tell me your heart rate?" "How many beats in 15 seconds?"  (Note: not all patients can do this)        no 8. CAUSE: "What do you think is causing the dizziness?"     unsure 9. RECURRENT SYMPTOM: "Have you had dizziness before?" If Yes, ask: "When was the last time?" "What happened that time?"     no 10. OTHER SYMPTOMS: "Do you have any other symptoms?" (e.g., fever, chest pain, vomiting, diarrhea, bleeding)       Nausea, headache 11. PREGNANCY: "Is there any chance you are pregnant?" "When was your last menstrual period?"       N/a  Protocols used: Dizziness - Lightheadedness-A-AH

## 2022-02-23 ENCOUNTER — Other Ambulatory Visit: Payer: Self-pay | Admitting: Nurse Practitioner

## 2022-02-24 NOTE — Telephone Encounter (Signed)
Requested Prescriptions  Pending Prescriptions Disp Refills   montelukast (SINGULAIR) 10 MG tablet [Pharmacy Med Name: MONTELUKAST SOD 10 MG TABLET] 90 tablet 2    Sig: TAKE 1 TABLET BY MOUTH EVERYDAY AT BEDTIME     Pulmonology:  Leukotriene Inhibitors Passed - 02/23/2022  1:47 AM      Passed - Valid encounter within last 12 months    Recent Outpatient Visits           1 month ago Dizziness   [place], PA-C   4 months ago Non-seasonal allergic rhinitis due to pollen   [facility], Bernice T, NP   5 months ago Benign breast cyst in female, left   [place], [place]child T, NP   6 months ago Elevated low density lipoprotein (LDL) cholesterol level   [place], [place] T, NP   1 year ago Routine general medical examination at a health care facility   [place], [place], DO       Future Appointments             In 6 months Venita Lick, NP [facility], [facility]            '

## 2022-08-21 ENCOUNTER — Other Ambulatory Visit: Payer: Self-pay | Admitting: Nurse Practitioner

## 2022-08-21 NOTE — Patient Instructions (Incomplete)
Please call to schedule your mammogram and/or bone density: [facility]  Address: [address], [place], [place] [address] Phone: [phone]  [facility] [address] [place],  [place]  [address] Phone: [phone]    Allergies, Adult An allergy is when your body reacts to something that bothers it (allergen). Allergies often affect the nose, eyes, skin, and stomach. They cannot spread from person to person. Allergies can start at any age. Sometimes, they go away as you get older. What are the causes? Outdoor things, like pollen, car fumes, and mold. Indoor things, like dust, smoke, mold, and pets. Foods. Medicines. Things that bother your skin. These include perfumes and bug bites. What increases the risk? Having family members with allergies or asthma. What are the signs or symptoms? Allergies may cause: A runny nose, stuffy nose, or sneezing. An itchy mouth, ears, or throat. A feeling of mucus dripping down the back of your throat. A sore throat. Eyes that itch or are red, watery, or puffy. A skin rash or red, swollen areas of skin (hives). Stomach cramps or bloating. If you have a very bad allergic reaction (anaphylactic reaction) to food, medicine, or bug bites, you may also have: A red face. Coughing. Swollen lips, tongue, or mouth. A tight or swollen throat. Chest pain. Your chest may feel tight. Your heart may beat too fast. Trouble breathing. Pain in your belly (abdomen). You may vomit or have watery poop (diarrhea). Fainting. Or you may feel dizzy. If you have a very bad reaction to an allergy, you should get help right away. How is this treated?     You may need to use: Cold, wet cloths. These can help with itching and swelling. Eye drops, nose sprays, or skin creams. A saline solution to wash out your nose. Saline solutions are made of salt and water. A  humidifier. Medicines. You may need to change the foods you eat. You may be given allergy shots, or a small bit of the allergen may be put under your tongue. These treatments can help your body get used to the allergen. If you have a very bad allergic reaction, you may need to use an auto-injector pen. An auto-injector pen is a device filled with medicine. It gives you an emergency shot of epinephrine. Your doctor will teach you how to use it. Follow these instructions at home: Medicines  Take or apply over-the-counter and prescription medicines only as told by your doctor. Keep an auto-injector pen with you all the time if you might have a very bad allergic reaction. Eating and drinking Follow instructions from your doctor about what you may eat and drink. Drink enough fluid to keep your pee (urine) pale yellow. General instructions If you have ever had a bad reaction, wear a medical alert bracelet or necklace. Stay away from the things you are allergic to. Keep all follow-up visits. Your doctor will check on how you are doing and talk about treatment options with you. Contact a doctor if: You do not get better with treatment. Get help right away if: You have a very bad allergic reaction. You use your auto-injector pen. You must go to the hospital even if the medicine seems to be working. These symptoms may be an emergency. Use the auto-injector pen right away. Then call 911. Do not wait to see if the symptoms will go away. Do not drive yourself to the hospital. This information  is not intended to replace advice given to you by your health care provider. Make sure you discuss any questions you have with your health care provider. Document Revised: 10/27/2021 Document Reviewed: 10/27/2021 Elsevier Patient Education  2024 [employer].

## 2022-08-22 NOTE — Telephone Encounter (Signed)
Requested Prescriptions  Pending Prescriptions Disp Refills   montelukast (SINGULAIR) 10 MG tablet [Pharmacy Med Name: MONTELUKAST SOD 10 MG TABLET] 90 tablet 1    Sig: TAKE 1 TABLET BY MOUTH EVERYDAY AT BEDTIME     Pulmonology:  Leukotriene Inhibitors Passed - 08/21/2022  2:15 AM      Passed - Valid encounter within last 12 months    Recent Outpatient Visits           7 months ago Dizziness   [place] [facility], Erin E, PA-C   10 months ago Non-seasonal allergic rhinitis due to pollen   [facility] [place], Corrie Dandy T, NP   11 months ago Benign breast cyst in female, left   [place] [facility], Benson T, NP   1 year ago Elevated low density lipoprotein (LDL) cholesterol level   [place] [facility] [place], Corrie Dandy T, NP   2 years ago Routine general medical examination at a health care facility   [facility] [place], Oralia Rud, DO       Future Appointments             Tomorrow Marjie Skiff, NP [place] [facility]

## 2022-08-23 ENCOUNTER — Encounter: Payer: Self-pay | Admitting: Nurse Practitioner

## 2022-08-23 ENCOUNTER — Ambulatory Visit (INDEPENDENT_AMBULATORY_CARE_PROVIDER_SITE_OTHER): Payer: Commercial Managed Care - PPO | Admitting: Nurse Practitioner

## 2022-08-23 VITALS — BP 132/81 | HR 58 | Temp 98.1°F | Ht 65.24 in | Wt 193.0 lb

## 2022-08-23 DIAGNOSIS — J301 Allergic rhinitis due to pollen: Secondary | ICD-10-CM | POA: Diagnosis not present

## 2022-08-23 DIAGNOSIS — N6002 Solitary cyst of left breast: Secondary | ICD-10-CM

## 2022-08-23 DIAGNOSIS — Z1231 Encounter for screening mammogram for malignant neoplasm of breast: Secondary | ICD-10-CM | POA: Diagnosis not present

## 2022-08-23 DIAGNOSIS — E7800 Pure hypercholesterolemia, unspecified: Secondary | ICD-10-CM | POA: Diagnosis not present

## 2022-08-23 DIAGNOSIS — E559 Vitamin D deficiency, unspecified: Secondary | ICD-10-CM

## 2022-08-23 DIAGNOSIS — Z1211 Encounter for screening for malignant neoplasm of colon: Secondary | ICD-10-CM

## 2022-08-23 DIAGNOSIS — Z0000 Encounter for general adult medical examination without abnormal findings: Secondary | ICD-10-CM

## 2022-08-23 MED ORDER — MONTELUKAST SODIUM 10 MG PO TABS: ORAL_TABLET | ORAL | 4 refills | Status: DC

## 2022-08-23 NOTE — Progress Notes (Signed)
BP 132/81   Pulse (!) 58   Temp 98.1 F (36.7 C) (Oral)   Ht 5' 5.24" (1.657 m)   Wt 193 lb (87.5 kg)   LMP  (LMP Unknown)   SpO2 99%   BMI 31.88 kg/m    Subjective:    Patient ID: Jeanette Henry, female    DOB: 08-Jan-1961, 62 y.o.   MRN: [id number]  HPI: Jeanette Henry is a 62 y.o. female presenting on 08/23/2022 for comprehensive medical examination. Current medical complaints include:none  She currently lives with: significant other Menopausal Symptoms: no  Continues on Vitamin D supplement for history of lows.  She is taking her Singulair and Flonase for allergic rhinitis.  Occasionally takes OTC antihistamines for further relief.    The 10-year ASCVD risk score (Arnett DK, et al., 2019) is: 4.8%   Values used to calculate the score:     Age: 23 years     Sex: Female     Is Non-Hispanic African American: No     Diabetic: No     Tobacco smoker: No     Systolic Blood Pressure: 132 mmHg     Is BP treated: No     HDL Cholesterol: 50 mg/dL     Total Cholesterol: 248 mg/dL     04/13/863    7:84 AM 01/07/2022   10:53 AM 08/20/2021    8:13 AM 08/17/2020    8:11 AM 08/15/2019    9:19 AM  Depression screen PHQ 2/9  Decreased Interest 0 0 0 0 0  Down, Depressed, Hopeless 0 0 0 0 0  PHQ - 2 Score 0 0 0 0 0  Altered sleeping 0 0 0  1  Tired, decreased energy 1 1 1   0  Change in appetite 0 1 0  0  Feeling bad or failure about yourself  0 0 0  0  Trouble concentrating 0 0 0  0  Moving slowly or fidgety/restless 0 0 0  0  Suicidal thoughts 0 0 0  0  PHQ-9 Score 1 2 1  1   Difficult doing work/chores Not difficult at all Not difficult at all Not difficult at all         08/23/2022    8:31 AM 01/07/2022   10:54 AM 08/20/2021    8:13 AM 08/15/2019    9:19 AM  GAD 7 : Generalized Anxiety Score  Nervous, Anxious, on Edge 0 0 0 0  Control/stop worrying 0 0 1 0  Worry too much - different things 0 1 1 1   Trouble relaxing 0 0 1 0  Restless 0 0 0 0  Easily annoyed or  irritable 0 0 0 0  Afraid - awful might happen 1 0 1 1  Total GAD 7 Score 1 1 4 2   Anxiety Difficulty Not difficult at all  Somewhat difficult Not difficult at all        07/11/2016    4:29 PM 04/19/2017    4:16 PM 08/17/2020    8:10 AM 08/20/2021    8:13 AM 01/07/2022   10:53 AM  Fall Risk  Falls in the past year? No No 0 0 0  Was there an injury with Fall?   0 0 0  Fall Risk Category Calculator   0 0 0  Fall Risk Category (Retired)   Low Low Low  (RETIRED) Patient Fall Risk Level   Low fall risk Low fall risk   Patient at Risk for Falls Due to  No Fall Risks No Fall Risks No Fall Risks  Fall risk Follow up   Falls evaluation completed Falls evaluation completed Falls evaluation completed     Past Medical History:  Past Medical History:  Diagnosis Date   Allergy    Vitamin D deficiency     Surgical History:  Past Surgical History:  Procedure Laterality Date   COLONOSCOPY WITH PROPOFOL N/A 08/11/2017   Procedure: COLONOSCOPY WITH PROPOFOL;  Surgeon: Pasty Spillers, MD;  Location: [facility] ENDOSCOPY;  Service: Gastroenterology;  Laterality: N/A;   TUBAL LIGATION     WRIST SURGERY Left     Medications:  Current Outpatient Medications on File Prior to Visit  Medication Sig   Acetaminophen (TYLENOL PO) Take by mouth daily.   fluticasone (FLONASE) 50 MCG/ACT nasal spray Place 1 spray into both nostrils 2 (two) times a day.   meclizine (ANTIVERT) 12.5 MG tablet Take 1 tablet (12.5 mg total) by mouth 3 (three) times daily as needed for dizziness.   ondansetron (ZOFRAN) 4 MG tablet Take 1 tablet (4 mg total) by mouth every 8 (eight) hours as needed for nausea or vomiting.   VITAMIN D, CHOLECALCIFEROL, PO Take 2,000 Units by mouth daily.   No current facility-administered medications on file prior to visit.    Allergies:  No Known Allergies  Social History:  Social History   Socioeconomic History   Marital status: Married    Spouse name: Not on file   Number of  children: Not on file   Years of education: Not on file   Highest education level: Not on file  Occupational History   Not on file  Tobacco Use   Smoking status: Never   Smokeless tobacco: Never  Vaping Use   Vaping Use: Never used  Substance and Sexual Activity   Alcohol use: No    Alcohol/week: 0.0 standard drinks of alcohol   Drug use: No   Sexual activity: Yes    Birth control/protection: Surgical  Other Topics Concern   Not on file  Social History Narrative   Not on file   Social Determinants of Health   Financial Resource Strain: Not on file  Food Insecurity: Not on file  Transportation Needs: Not on file  Physical Activity: Not on file  Stress: Not on file  Social Connections: Not on file  Intimate Partner Violence: Not on file   Social History   Tobacco Use  Smoking Status Never  Smokeless Tobacco Never   Social History   Substance and Sexual Activity  Alcohol Use No   Alcohol/week: 0.0 standard drinks of alcohol    Family History:  Family History  Problem Relation Age of Onset   Hypertension Mother    Cancer Mother        skin   Heart disease Father    Breast cancer Neg Hx     Past medical history, surgical history, medications, allergies, family history and social history reviewed with patient today and changes made to appropriate areas of the chart.   ROS All other ROS negative except what is listed above and in the HPI.      Objective:    BP 132/81   Pulse (!) 58   Temp 98.1 F (36.7 C) (Oral)   Ht 5' 5.24" (1.657 m)   Wt 193 lb (87.5 kg)   LMP  (LMP Unknown)   SpO2 99%   BMI 31.88 kg/m   Wt Readings from Last 3 Encounters:  08/23/22 193 lb (87.5 kg)  01/07/22 189 lb 8 oz (86 kg)  10/15/21 190 lb 11.2 oz (86.5 kg)    Physical Exam Vitals and nursing note reviewed. Exam conducted with a chaperone present.  Constitutional:      General: She is awake. She is not in acute distress.    Appearance: She is well-developed and  well-groomed. She is not ill-appearing or toxic-appearing.  HENT:     Head: Normocephalic and atraumatic.     Right Ear: Hearing, tympanic membrane, ear canal and external ear normal. No drainage.     Left Ear: Hearing, tympanic membrane, ear canal and external ear normal. No drainage.     Nose: Nose normal.     Right Sinus: No maxillary sinus tenderness or frontal sinus tenderness.     Left Sinus: No maxillary sinus tenderness or frontal sinus tenderness.     Mouth/Throat:     Mouth: Mucous membranes are moist.     Pharynx: Oropharynx is clear. Uvula midline. No pharyngeal swelling, oropharyngeal exudate or posterior oropharyngeal erythema.  Eyes:     General: Lids are normal.        Right eye: No discharge.        Left eye: No discharge.     Extraocular Movements: Extraocular movements intact.     Conjunctiva/sclera: Conjunctivae normal.     Pupils: Pupils are equal, round, and reactive to light.     Visual Fields: Right eye visual fields normal and left eye visual fields normal.  Neck:     Thyroid: No thyromegaly.     Vascular: No carotid bruit.     Trachea: Trachea normal.  Cardiovascular:     Rate and Rhythm: Regular rhythm. Bradycardia present.     Heart sounds: Normal heart sounds. No murmur heard.    No gallop.  Pulmonary:     Effort: Pulmonary effort is normal. No accessory muscle usage or respiratory distress.     Breath sounds: Normal breath sounds.  Chest:  Breasts:    Right: Normal.     Left: Normal.  Abdominal:     General: Bowel sounds are normal.     Palpations: Abdomen is soft. There is no hepatomegaly or splenomegaly.     Tenderness: There is no abdominal tenderness.  Musculoskeletal:        General: Normal range of motion.     Cervical back: Normal range of motion and neck supple.     Right lower leg: No edema.     Left lower leg: No edema.  Lymphadenopathy:     Head:     Right side of head: No submental, submandibular, tonsillar, preauricular or  posterior auricular adenopathy.     Left side of head: No submental, submandibular, tonsillar, preauricular or posterior auricular adenopathy.     Cervical: No cervical adenopathy.     Upper Body:     Right upper body: No supraclavicular, axillary or pectoral adenopathy.     Left upper body: No supraclavicular, axillary or pectoral adenopathy.  Skin:    General: Skin is warm and dry.     Capillary Refill: Capillary refill takes less than 2 seconds.     Findings: No rash.  Neurological:     Mental Status: She is alert and oriented to person, place, and time.     Gait: Gait is intact.     Deep Tendon Reflexes: Reflexes are normal and symmetric.     Reflex Scores:      Brachioradialis reflexes are 2+ on the right side  and 2+ on the left side.      Patellar reflexes are 2+ on the right side and 2+ on the left side. Psychiatric:        Attention and Perception: Attention normal.        Mood and Affect: Mood normal.        Speech: Speech normal.        Behavior: Behavior normal. Behavior is cooperative.        Thought Content: Thought content normal.        Judgment: Judgment normal.    Results for orders placed or performed in visit on 08/20/21  CBC with Differential/Platelet  Result Value Ref Range   WBC 6.4 3.4 - 10.8 x10E3/uL   RBC 4.66 3.77 - 5.28 x10E6/uL   Hemoglobin 14.3 11.1 - 15.9 g/dL   Hematocrit 60.4 54.0 - 46.6 %   MCV 89 79 - 97 fL   MCH 30.7 26.6 - 33.0 pg   MCHC 34.4 31.5 - 35.7 g/dL   RDW 98.1 19.1 - 47.8 %   Platelets 251 150 - 450 x10E3/uL   Neutrophils 57 Not Estab. %   Lymphs 34 Not Estab. %   Monocytes 6 Not Estab. %   Eos 2 Not Estab. %   Basos 1 Not Estab. %   Neutrophils Absolute 3.7 1.4 - 7.0 x10E3/uL   Lymphocytes Absolute 2.2 0.7 - 3.1 x10E3/uL   Monocytes Absolute 0.4 0.1 - 0.9 x10E3/uL   EOS (ABSOLUTE) 0.1 0.0 - 0.4 x10E3/uL   Basophils Absolute 0.1 0.0 - 0.2 x10E3/uL   Immature Granulocytes 0 Not Estab. %   Immature Grans (Abs) 0.0 0.0 - 0.1  x10E3/uL  Comprehensive metabolic panel  Result Value Ref Range   Glucose 93 70 - 99 mg/dL   BUN 11 8 - 27 mg/dL   Creatinine, Ser 2.95 0.57 - 1.00 mg/dL   eGFR 90 >62 ZH/YQM/5.78   BUN/Creatinine Ratio 14 12 - 28   Sodium 141 134 - 144 mmol/L   Potassium 4.1 3.5 - 5.2 mmol/L   Chloride 103 96 - 106 mmol/L   CO2 22 20 - 29 mmol/L   Calcium 9.6 8.7 - 10.3 mg/dL   Total Protein 7.1 6.0 - 8.5 g/dL   Albumin 4.7 3.8 - 4.9 g/dL   Globulin, Total 2.4 1.5 - 4.5 g/dL   Albumin/Globulin Ratio 2.0 1.2 - 2.2   Bilirubin Total 0.2 0.0 - 1.2 mg/dL   Alkaline Phosphatase 89 44 - 121 IU/L   AST 30 0 - 40 IU/L   ALT 32 0 - 32 IU/L  Lipid Panel w/o Chol/HDL Ratio  Result Value Ref Range   Cholesterol, Total 248 (H) 100 - 199 mg/dL   Triglycerides 469 (H) 0 - 149 mg/dL   HDL 50 >62 mg/dL   VLDL Cholesterol Cal 46 (H) 5 - 40 mg/dL   LDL Chol Calc (NIH) 952 (H) 0 - 99 mg/dL  TSH  Result Value Ref Range   TSH 1.670 0.450 - 4.500 uIU/mL  VITAMIN D 25 Hydroxy (Vit-D Deficiency, Fractures)  Result Value Ref Range   Vit D, 25-Hydroxy 27.3 (L) 30.0 - 100.0 ng/mL  Cytology - PAP  Result Value Ref Range   High risk HPV Negative    Adequacy      Satisfactory for evaluation; transformation zone component ABSENT.   Diagnosis      - Negative for intraepithelial lesion or malignancy (NILM)   Comment Normal Reference Range HPV - Negative  Assessment & Plan:   Problem List Items Addressed This Visit       Respiratory   Allergic rhinitis    Chronic, ongoing issue.  Continue Singulair which is offering some benefit + continue Flonase and Claritin OTC.  Discussed BLACK BOX warning with her.  Could consider referral to allergist if ongoing issues with allergies, has seen ENT in past with assessment - but no allergy testing.      Relevant Orders   CBC with Differential/Platelet     Other   Elevated low density lipoprotein (LDL) cholesterol level - Primary    Noted on past labs, ASCVD 4.8%.  Continue diet focus and recheck labs today.      Relevant Orders   Comprehensive metabolic panel   Lipid Panel w/o Chol/HDL Ratio   Vitamin D deficiency    Chronic, ongoing taking supplement.  Recheck level today.      Relevant Orders   VITAMIN D 25 Hydroxy (Vit-D Deficiency, Fractures)   Other Visit Diagnoses     Encounter for screening mammogram for malignant neoplasm of breast       Mammogram ordered, due in July.   Relevant Orders   MM 3D SCREENING MAMMOGRAM BILATERAL BREAST   Colon cancer screening       Referral to GI placed and discussed with patient.   Relevant Orders   Ambulatory referral to Gastroenterology   Encounter for annual physical exam       Annual physical today with labs and health maintenance reviewed, discussed with patient.   Relevant Orders   TSH        Follow up plan: Return in about 1 year (around 08/23/2023) for Annual physical.   LABORATORY TESTING:  - Pap smear: up to date  IMMUNIZATIONS:   - Tdap: Tetanus vaccination status reviewed: last tetanus booster within 10 years. - Influenza: Up to date - Pneumovax: Not applicable - Prevnar: Not applicable - COVID: Up to date - HPV: Not applicable - Shingrix vaccine: Refused  SCREENING: -Mammogram: Ordered today  - Colonoscopy: Ordered today  - Bone Density: Not applicable  -Hearing Test: Not applicable  -Spirometry: Not applicable   PATIENT COUNSELING:   Advised to take 1 mg of folate supplement per day if capable of pregnancy.   Sexuality: Discussed sexually transmitted diseases, partner selection, use of condoms, avoidance of unintended pregnancy  and contraceptive alternatives.   Advised to avoid cigarette smoking.  I discussed with the patient that most people either abstain from alcohol or drink within safe limits (<=14/week and <=4 drinks/occasion for males, <=7/weeks and <= 3 drinks/occasion for females) and that the risk for alcohol disorders and other health effects rises  proportionally with the number of drinks per week and how often a drinker exceeds daily limits.  Discussed cessation/primary prevention of drug use and availability of treatment for abuse.   Diet: Encouraged to adjust caloric intake to maintain  or achieve ideal body weight, to reduce intake of dietary saturated fat and total fat, to limit sodium intake by avoiding high sodium foods and not adding table salt, and to maintain adequate dietary potassium and calcium preferably from fresh fruits, vegetables, and low-fat dairy products.    Stressed the importance of regular exercise  Injury prevention: Discussed safety belts, safety helmets, smoke detector, smoking near bedding or upholstery.   Dental health: Discussed importance of regular tooth brushing, flossing, and dental visits.    NEXT PREVENTATIVE PHYSICAL DUE IN 1 YEAR. Return in about 1 year (around 08/23/2023)  for Annual physical.

## 2022-08-23 NOTE — Assessment & Plan Note (Signed)
Chronic, ongoing issue.  Continue Singulair which is offering some benefit + continue Flonase and Claritin OTC.  Discussed BLACK BOX warning with her.  Could consider referral to allergist if ongoing issues with allergies, has seen ENT in past with assessment - but no allergy testing.

## 2022-08-23 NOTE — Assessment & Plan Note (Signed)
Chronic, ongoing taking supplement.  Recheck level today.

## 2022-08-23 NOTE — Assessment & Plan Note (Signed)
Noted on past labs, ASCVD 4.8%. Continue diet focus and recheck labs today.

## 2022-08-24 LAB — COMPREHENSIVE METABOLIC PANEL
ALT: 20 IU/L (ref 0–32)
AST: 21 IU/L (ref 0–40)
Albumin: 4.4 g/dL (ref 3.9–4.9)
Alkaline Phosphatase: 94 IU/L (ref 44–121)
BUN/Creatinine Ratio: 29 — ABNORMAL HIGH (ref 12–28)
BUN: 18 mg/dL (ref 8–27)
Bilirubin Total: 0.3 mg/dL (ref 0.0–1.2)
CO2: 24 mmol/L (ref 20–29)
Calcium: 9.4 mg/dL (ref 8.7–10.3)
Chloride: 103 mmol/L (ref 96–106)
Creatinine, Ser: 0.63 mg/dL (ref 0.57–1.00)
Globulin, Total: 1.8 g/dL (ref 1.5–4.5)
Glucose: 94 mg/dL (ref 70–99)
Potassium: 4.4 mmol/L (ref 3.5–5.2)
Sodium: 140 mmol/L (ref 134–144)
Total Protein: 6.2 g/dL (ref 6.0–8.5)
eGFR: 101 mL/min/{1.73_m2} (ref >59)

## 2022-08-24 LAB — CBC WITH DIFFERENTIAL/PLATELET
Basophils Absolute: 0 10*3/uL (ref 0.0–0.2)
Basos: 1 %
EOS (ABSOLUTE): 0.1 10*3/uL (ref 0.0–0.4)
Eos: 2 %
Hematocrit: 41.5 % (ref 34.0–46.6)
Hemoglobin: 13.8 g/dL (ref 11.1–15.9)
Immature Grans (Abs): 0 10*3/uL (ref 0.0–0.1)
Immature Granulocytes: 0 %
Lymphocytes Absolute: 2.1 10*3/uL (ref 0.7–3.1)
Lymphs: 34 %
MCH: 30.4 pg (ref 26.6–33.0)
MCHC: 33.3 g/dL (ref 31.5–35.7)
MCV: 91 fL (ref 79–97)
Monocytes Absolute: 0.4 10*3/uL (ref 0.1–0.9)
Monocytes: 6 %
Neutrophils Absolute: 3.6 10*3/uL (ref 1.4–7.0)
Neutrophils: 57 %
Platelets: 233 10*3/uL (ref 150–450)
RBC: 4.54 x10E6/uL (ref 3.77–5.28)
RDW: 12.8 % (ref 11.7–15.4)
WBC: 6.3 10*3/uL (ref 3.4–10.8)

## 2022-08-24 LAB — TSH: TSH: 1.76 u[IU]/mL (ref 0.450–4.500)

## 2022-08-24 LAB — LIPID PANEL W/O CHOL/HDL RATIO
Cholesterol, Total: 229 mg/dL — ABNORMAL HIGH (ref 100–199)
HDL: 55 mg/dL (ref >39)
LDL Chol Calc (NIH): 139 mg/dL — ABNORMAL HIGH (ref 0–99)
Triglycerides: 194 mg/dL — ABNORMAL HIGH (ref 0–149)
VLDL Cholesterol Cal: 35 mg/dL (ref 5–40)

## 2022-08-24 LAB — VITAMIN D 25 HYDROXY (VIT D DEFICIENCY, FRACTURES): Vit D, 25-Hydroxy: 21.1 ng/mL — ABNORMAL LOW (ref 30.0–100.0)

## 2022-08-24 NOTE — Progress Notes (Signed)
Contacted via MyChart -- but please call as appears she may not check:  Good morning Jeanette Henry, your labs have returned and remain overall stable with exception of cholesterol levels which remain elevated, we do not need to start medication yet, but I do recommend heavy focus on healthy diet and regular exercise to help lower levels.  Vitamin D level remains low, please ensure you are taking Vitamin D3 2000 units daily for overall bone health.  Any questions? Keep being wonderful!!  Thank you for allowing me to participate in your care.  I appreciate you. Kindest regards, Carinne Brandenburger

## 2022-08-29 ENCOUNTER — Telehealth: Payer: Self-pay | Admitting: *Deleted

## 2022-08-29 ENCOUNTER — Other Ambulatory Visit: Payer: Self-pay | Admitting: *Deleted

## 2022-08-29 DIAGNOSIS — Z86010 Personal history of colonic polyps: Secondary | ICD-10-CM

## 2022-08-29 NOTE — Telephone Encounter (Signed)
Gastroenterology Pre-Procedure Review  Request Date: 11/28/2022 Requesting Physician: Dr. Allegra Lai  PATIENT REVIEW QUESTIONS: The patient responded to the following health history questions as indicated:    1. Are you having any GI issues? no 2. Do you have a personal history of Polyps? yes (08/11/2017) 3. Do you have a family history of Colon Cancer or Polyps? no 4. Diabetes Mellitus? no 5. Joint replacements in the past 12 months?no 6. Major health problems in the past 3 months?no 7. Any artificial heart valves, MVP, or defibrillator?no    MEDICATIONS & ALLERGIES:    Patient reports the following regarding taking any anticoagulation/antiplatelet therapy:   Plavix, Coumadin, Eliquis, Xarelto, Lovenox, Pradaxa, Brilinta, or Effient? no Aspirin? no  Patient confirms/reports the following medications:  Current Outpatient Medications  Medication Sig Dispense Refill   Acetaminophen (TYLENOL PO) Take by mouth daily.     fluticasone (FLONASE) 50 MCG/ACT nasal spray Place 1 spray into both nostrils 2 (two) times a day. 16 g 6   meclizine (ANTIVERT) 12.5 MG tablet Take 1 tablet (12.5 mg total) by mouth 3 (three) times daily as needed for dizziness. 30 tablet 0   montelukast (SINGULAIR) 10 MG tablet TAKE 1 TABLET BY MOUTH EVERYDAY AT BEDTIME 90 tablet 4   ondansetron (ZOFRAN) 4 MG tablet Take 1 tablet (4 mg total) by mouth every 8 (eight) hours as needed for nausea or vomiting. 20 tablet 0   VITAMIN D, CHOLECALCIFEROL, PO Take 2,000 Units by mouth daily.     No current facility-administered medications for this visit.    Patient confirms/reports the following allergies:  No Known Allergies  No orders of the defined types were placed in this encounter.   AUTHORIZATION INFORMATION Primary Insurance: 1D#: Group #:  Secondary Insurance: 1D#: Group #:  SCHEDULE INFORMATION: Date: 11/28/2022 Time: Location:  [facility]

## 2022-09-20 ENCOUNTER — Telehealth: Payer: Self-pay

## 2022-09-20 ENCOUNTER — Encounter: Payer: Self-pay | Admitting: *Deleted

## 2022-09-20 NOTE — Telephone Encounter (Signed)
Patient called back. We are rescheduling to 12/05/2022. Called endo unit to make the change.  New instructions will sent.

## 2022-09-20 NOTE — Telephone Encounter (Signed)
Dr. Allegra Lai is not going to be in the office on 11/28/2022 can you please reschedule patient to a different day

## 2022-09-20 NOTE — Telephone Encounter (Signed)
Message left for patient to return my call.  

## 2022-09-27 ENCOUNTER — Ambulatory Visit
Admission: RE | Admit: 2022-09-27 | Discharge: 2022-09-27 | Disposition: A | Discharge status: Home / Self Care | Payer: Commercial Managed Care - PPO | Source: Ambulatory Visit | Attending: Nurse Practitioner | Admitting: Nurse Practitioner

## 2022-09-27 DIAGNOSIS — Z1231 Encounter for screening mammogram for malignant neoplasm of breast: Secondary | ICD-10-CM | POA: Diagnosis present

## 2022-09-28 NOTE — Progress Notes (Signed)
Contacted via MyChart   Normal mammogram, may repeat in one year:)

## 2022-11-21 ENCOUNTER — Telehealth: Payer: Self-pay | Admitting: Gastroenterology

## 2022-11-21 MED ORDER — NA SULFATE-K SULFATE-MG SULF 17.5-3.13-1.6 GM/177ML PO SOLN: 1.0000 | Freq: Once | ORAL | 0 refills | Status: AC

## 2022-11-21 NOTE — Addendum Note (Signed)
Addended by: Tawnya Crook on: 11/21/2022 09:26 AM   Modules accepted: Orders

## 2022-11-21 NOTE — Telephone Encounter (Signed)
Spoken to patient. During our last conversation on 08/29/2022, patient wanted me to delay the Rx. I had a reminder to have myself call patient and sent Rx on Friday, 11/25/2022.  I went ahead and sent it today since patient called.

## 2022-11-21 NOTE — Telephone Encounter (Signed)
Patient called in because she have not received her prep for her procedure, her procedure is on 12/05/2022. Her is [facility] in [place].

## 2022-12-05 ENCOUNTER — Encounter
Admission: RE | Disposition: A | Discharge status: Home / Self Care | Payer: Self-pay | Source: Home / Self Care | Attending: Gastroenterology

## 2022-12-05 ENCOUNTER — Ambulatory Visit: Payer: Commercial Managed Care - PPO | Admitting: Anesthesiology

## 2022-12-05 ENCOUNTER — Ambulatory Visit
Admission: RE | Admit: 2022-12-05 | Discharge: 2022-12-05 | Disposition: A | Discharge status: Home / Self Care | Payer: Commercial Managed Care - PPO | Attending: Gastroenterology | Admitting: Gastroenterology

## 2022-12-05 DIAGNOSIS — Z860101 Personal history of adenomatous and serrated colon polyps: Secondary | ICD-10-CM | POA: Insufficient documentation

## 2022-12-05 DIAGNOSIS — Z09 Encounter for follow-up examination after completed treatment for conditions other than malignant neoplasm: Secondary | ICD-10-CM | POA: Diagnosis not present

## 2022-12-05 DIAGNOSIS — Z860100 Personal history of colon polyps, unspecified: Secondary | ICD-10-CM

## 2022-12-05 DIAGNOSIS — Z1211 Encounter for screening for malignant neoplasm of colon: Secondary | ICD-10-CM | POA: Insufficient documentation

## 2022-12-05 HISTORY — PX: COLONOSCOPY WITH PROPOFOL: SHX5780

## 2022-12-05 SURGERY — COLONOSCOPY WITH PROPOFOL
Anesthesia: General

## 2022-12-05 MED ORDER — SODIUM CHLORIDE 0.9 % IV SOLN
INTRAVENOUS | Status: DC
  Administered 2022-12-05: 20 mL/h via INTRAVENOUS

## 2022-12-05 MED ORDER — STERILE WATER FOR IRRIGATION IR SOLN
Status: DC | PRN
  Administered 2022-12-05: 60 mL

## 2022-12-05 MED ORDER — PROPOFOL 10 MG/ML IV BOLUS
INTRAVENOUS | Status: AC
  Filled 2022-12-05: qty 40

## 2022-12-05 MED ORDER — LIDOCAINE HCL (CARDIAC) PF 100 MG/5ML IV SOSY
PREFILLED_SYRINGE | INTRAVENOUS | Status: DC | PRN
  Administered 2022-12-05: 50 mg via INTRAVENOUS

## 2022-12-05 MED ORDER — PROPOFOL 10 MG/ML IV BOLUS
INTRAVENOUS | Status: DC | PRN
Start: 2022-12-05 — End: 2022-12-05
  Administered 2022-12-05: 100 ug/kg/min via INTRAVENOUS
  Administered 2022-12-05 (×2): 40 mg via INTRAVENOUS
  Administered 2022-12-05: 60 mg via INTRAVENOUS

## 2022-12-05 NOTE — Transfer of Care (Signed)
Immediate Anesthesia Transfer of Care Note  Patient: Wilfred Curtis Elbaum  Procedure(s) Performed: COLONOSCOPY WITH PROPOFOL  Patient Location: PACU  Anesthesia Type:MAC  Level of Consciousness: drowsy  Airway & Oxygen Therapy: Patient Spontanous Breathing  Post-op Assessment: Report given to RN and Post -op Vital signs reviewed and stable  Post vital signs: Reviewed and stable  Last Vitals:  Vitals Value Taken Time  BP 112/65 12/05/22 0816  Temp    Pulse 63 12/05/22 0816  Resp 15 12/05/22 0816  SpO2 100 % 12/05/22 0816  Vitals shown include unfiled device data.  Last Pain:  Vitals:   12/05/22 0622  TempSrc: Temporal  PainSc: 0-No pain         Complications: No notable events documented.

## 2022-12-05 NOTE — H&P (Signed)
Arlyss Repress, MD [address]  [address]  [place], [place] [address]  Main: [phone]  Fax: [phone] Pager: [phone]  Primary Care Physician:  Marjie Skiff, NP Primary Gastroenterologist:  Dr. Arlyss Repress  Pre-Procedure History & Physical: HPI:  Pearson Reasons is a 62 y.o. female is here for an colonoscopy.   Past Medical History:  Diagnosis Date   Allergy    Vitamin D deficiency     Past Surgical History:  Procedure Laterality Date   COLONOSCOPY WITH PROPOFOL N/A 08/11/2017   Procedure: COLONOSCOPY WITH PROPOFOL;  Surgeon: Pasty Spillers, MD;  Location: [facility] ENDOSCOPY;  Service: Gastroenterology;  Laterality: N/A;   TUBAL LIGATION     WRIST SURGERY Left     Prior to Admission medications   Medication Sig Start Date End Date Taking? Authorizing Provider  Acetaminophen (TYLENOL PO) Take by mouth daily.   Yes [provider]  fluticasone (FLONASE) 50 MCG/ACT nasal spray Place 1 spray into both nostrils 2 (two) times a day. 07/26/18  Yes Particia Nearing, PA-C  meclizine (ANTIVERT) 12.5 MG tablet Take 1 tablet (12.5 mg total) by mouth 3 (three) times daily as needed for dizziness. 01/07/22  Yes Mecum, Erin E, PA-C  montelukast (SINGULAIR) 10 MG tablet TAKE 1 TABLET BY MOUTH EVERYDAY AT BEDTIME 08/23/22  Yes Cannady, Jolene T, NP  ondansetron (ZOFRAN) 4 MG tablet Take 1 tablet (4 mg total) by mouth every 8 (eight) hours as needed for nausea or vomiting. 01/07/22  Yes Mecum, Erin E, PA-C  VITAMIN D, CHOLECALCIFEROL, PO Take 2,000 Units by mouth daily.   Yes [provider]    Allergies as of 08/29/2022   (No Known Allergies)    Family History  Problem Relation Age of Onset   Hypertension Mother    Cancer Mother        skin   Heart disease Father    Breast cancer Neg Hx     Social History   Socioeconomic History   Marital status: Married    Spouse name: Not on file   Number of children: Not on file   Years of  education: Not on file   Highest education level: Not on file  Occupational History   Not on file  Tobacco Use   Smoking status: Never   Smokeless tobacco: Never  Vaping Use   Vaping status: Never Used  Substance and Sexual Activity   Alcohol use: No    Alcohol/week: 0.0 standard drinks of alcohol   Drug use: No   Sexual activity: Yes    Birth control/protection: Surgical  Other Topics Concern   Not on file  Social History Narrative   Not on file   Social Determinants of Health   Financial Resource Strain: Not on file  Food Insecurity: Not on file  Transportation Needs: Not on file  Physical Activity: Not on file  Stress: Not on file  Social Connections: Not on file  Intimate Partner Violence: Not on file    Review of Systems: See HPI, otherwise negative ROS  Physical Exam: BP (!) 146/86   Pulse 81   Temp (!) 96.4 F (35.8 C) (Temporal)   Resp 20   Ht 5\' 5"  (1.651 m)   Wt 81.2 kg   LMP  (LMP Unknown)   SpO2 99%   BMI 29.79 kg/m  General:   Alert,  pleasant and cooperative in NAD Head:  Normocephalic and atraumatic. Neck:  Supple; no masses or thyromegaly.  Lungs:  Clear throughout to auscultation.    Heart:  Regular rate and rhythm. Abdomen:  Soft, nontender and nondistended. Normal bowel sounds, without guarding, and without rebound.   Neurologic:  Alert and  oriented x4;  grossly normal neurologically.  Impression/Plan: Loraine Bhullar is here for an colonoscopy to be performed for h/o colon adenoma  Risks, benefits, limitations, and alternatives regarding  colonoscopy have been reviewed with the patient.  Questions have been answered.  All parties agreeable.   Lannette Donath, MD  12/05/2022, 7:47 AM

## 2022-12-05 NOTE — Anesthesia Preprocedure Evaluation (Signed)
Anesthesia Evaluation  Patient identified by MRN, date of birth, ID band Patient awake    Reviewed: Allergy & Precautions, H&P , NPO status , Patient's Chart, lab work & pertinent test results, reviewed documented beta blocker date and time   Airway Mallampati: II   Neck ROM: full    Dental  (+) Poor Dentition   Pulmonary neg pulmonary ROS   Pulmonary exam normal        Cardiovascular negative cardio ROS Normal cardiovascular exam Rhythm:regular Rate:Normal     Neuro/Psych negative neurological ROS  negative psych ROS   GI/Hepatic negative GI ROS, Neg liver ROS,,,  Endo/Other  negative endocrine ROS    Renal/GU negative Renal ROS  negative genitourinary   Musculoskeletal   Abdominal   Peds  Hematology negative hematology ROS (+)   Anesthesia Other Findings Past Medical History: No date: Allergy No date: Vitamin D deficiency Past Surgical History: 08/11/2017: COLONOSCOPY WITH PROPOFOL; N/A     Comment:  Procedure: COLONOSCOPY WITH PROPOFOL;  Surgeon:               Pasty Spillers, MD;  Location: [facility] ENDOSCOPY;                Service: Gastroenterology;  Laterality: N/A; No date: TUBAL LIGATION No date: WRIST SURGERY; Left BMI    Body Mass Index: 29.79 kg/m     Reproductive/Obstetrics negative OB ROS                             Anesthesia Physical Anesthesia Plan  ASA: 2  Anesthesia Plan: General   Post-op Pain Management:    Induction:   PONV Risk Score and Plan:   Airway Management Planned:   Additional Equipment:   Intra-op Plan:   Post-operative Plan:   Informed Consent: I have reviewed the patients History and Physical, chart, labs and discussed the procedure including the risks, benefits and alternatives for the proposed anesthesia with the patient or authorized representative who has indicated his/her understanding and acceptance.     Dental Advisory  Given  Plan Discussed with: CRNA  Anesthesia Plan Comments:        Anesthesia Quick Evaluation

## 2022-12-05 NOTE — Anesthesia Postprocedure Evaluation (Signed)
Anesthesia Post Note  Patient: Jeanette Henry  Procedure(s) Performed: COLONOSCOPY WITH PROPOFOL  Patient location during evaluation: PACU Anesthesia Type: General Level of consciousness: awake and alert Pain management: pain level controlled Vital Signs Assessment: post-procedure vital signs reviewed and stable Respiratory status: spontaneous breathing, nonlabored ventilation, respiratory function stable and patient connected to nasal cannula oxygen Cardiovascular status: blood pressure returned to baseline and stable Postop Assessment: no apparent nausea or vomiting Anesthetic complications: no   No notable events documented.   Last Vitals:  Vitals:   12/05/22 0826 12/05/22 0836  BP: 129/78 (!) 155/80  Pulse: (!) 54 60  Resp: 13 12  Temp:    SpO2: 100% 100%    Last Pain:  Vitals:   12/05/22 0836  TempSrc:   PainSc: 0-No pain                 Yevette Edwards

## 2022-12-05 NOTE — Anesthesia Procedure Notes (Addendum)
Date/Time: 12/05/2022 7:58 AM  Performed by: Elisabeth Pigeon, CRNAPre-anesthesia Checklist: Patient identified, Emergency Drugs available, Suction available, Timeout performed and Patient being monitored Oxygen Delivery Method: Nasal cannula

## 2022-12-05 NOTE — Op Note (Signed)
[facility] Gastroenterology Patient Name: Jeanette Henry Procedure Date: 12/05/2022 7:54 AM MRN: [id number] Account #: [id number] Date of Birth: 02-28-1961 Admit Type: Outpatient Age: 62 Room: [facility] ENDO ROOM 4 Gender: Female Note Status: Finalized Instrument Name: Prentice Docker [id number] Procedure:             Colonoscopy Indications:           Surveillance: Personal history of adenomatous polyps                         on last colonoscopy 5 years ago, Last colonoscopy:                         June 2019 Providers:             Toney Reil MD, MD Referring MD:          Toney Reil MD, MD (Referring MD), Dorie Rank.                         Harvest Dark (Referring MD) Medicines:             General Anesthesia Complications:         No immediate complications. Estimated blood loss: None. Procedure:             Pre-Anesthesia Assessment:                        - Prior to the procedure, a History and Physical was                         performed, and patient medications and allergies were                         reviewed. The patient is competent. The risks and                         benefits of the procedure and the sedation options and                         risks were discussed with the patient. All questions                         were answered and informed consent was obtained.                         Patient identification and proposed procedure were                         verified by the physician, the nurse, the                         anesthesiologist, the anesthetist and the technician                         in the pre-procedure area in the procedure room in the                         endoscopy suite. Mental Status Examination: alert and  oriented. Airway Examination: normal oropharyngeal                         airway and neck mobility. Respiratory Examination:                         clear to auscultation. CV Examination:  normal.                         Prophylactic Antibiotics: The patient does not require                         prophylactic antibiotics. Prior Anticoagulants: The                         patient has taken no anticoagulant or antiplatelet                         agents. ASA Grade Assessment: II - A patient with mild                         systemic disease. After reviewing the risks and                         benefits, the patient was deemed in satisfactory                         condition to undergo the procedure. The anesthesia                         plan was to use general anesthesia. Immediately prior                         to administration of medications, the patient was                         re-assessed for adequacy to receive sedatives. The                         heart rate, respiratory rate, oxygen saturations,                         blood pressure, adequacy of pulmonary ventilation, and                         response to care were monitored throughout the                         procedure. The physical status of the patient was                         re-assessed after the procedure.                        After obtaining informed consent, the colonoscope was                         passed under direct vision. Throughout the procedure,  the patient's blood pressure, pulse, and oxygen                         saturations were monitored continuously. The                         Colonoscope was introduced through the anus and                         advanced to the the terminal ileum, with                         identification of the appendiceal orifice and IC                         valve. The colonoscopy was performed without                         difficulty. The patient tolerated the procedure well.                         The quality of the bowel preparation was evaluated                         using the BBPS [facility] Bowel Preparation Scale) with                          scores of: Right Colon = 3, Transverse Colon = 3 and                         Left Colon = 3 (entire mucosa seen well with no                         residual staining, small fragments of stool or opaque                         liquid). The total BBPS score equals 9. The terminal                         ileum, ileocecal valve, appendiceal orifice, and                         rectum were photographed. Findings:      The perianal and digital rectal examinations were normal. Pertinent       negatives include normal sphincter tone and no palpable rectal lesions.      The terminal ileum appeared normal.      The entire examined colon appeared normal.      The retroflexed view of the distal rectum and anal verge was normal and       showed no anal or rectal abnormalities. Impression:            - The examined portion of the ileum was normal.                        - The entire examined colon is normal.                        -  The distal rectum and anal verge are normal on                         retroflexion view.                        - No specimens collected. Recommendation:        - Discharge patient to home (with escort).                        - Resume previous diet today.                        - Continue present medications.                        - Repeat colonoscopy in 10 years for screening                         purposes. Procedure Code(s):     --- Professional ---                        O1308, Colorectal cancer screening; colonoscopy on                         individual at high risk Diagnosis Code(s):     --- Professional ---                        Z86.010, Personal history of colonic polyps CPT copyright 2022 American Medical Association. All rights reserved. The codes documented in this report are preliminary and upon coder review may  be revised to meet current compliance requirements. Dr. Libby Maw Toney Reil MD, MD 12/05/2022 8:13:57  AM This report has been signed electronically. Number of Addenda: 0 Note Initiated On: 12/05/2022 7:54 AM Scope Withdrawal Time: 0 hours 7 minutes 42 seconds  Total Procedure Duration: 0 hours 11 minutes 58 seconds  Estimated Blood Loss:  Estimated blood loss: none.      [facility]

## 2022-12-06 ENCOUNTER — Encounter: Payer: Self-pay | Admitting: Gastroenterology

## 2023-01-14 IMAGING — MG DIGITAL DIAGNOSTIC BILAT W/ TOMO W/ CAD
6 of 10 series · 6 of 30 positions shown · non-contrast
Comparison: Previous exam(s).

CLINICAL DATA: Palpable LEFT breast region. Initially pea-sized.
Patient wondered whether she had a bug bite at one point. This is
decreased in size.

EXAM:
DIGITAL DIAGNOSTIC BILATERAL MAMMOGRAM WITH TOMOSYNTHESIS AND CAD;
ULTRASOUND LEFT BREAST LIMITED
TECHNIQUE: Bilateral digital diagnostic mammography and breast tomosynthesis
was performed. The images were evaluated with computer-aided
detection.; Targeted ultrasound examination of the left breast was
performed

[R CC synth-2D]
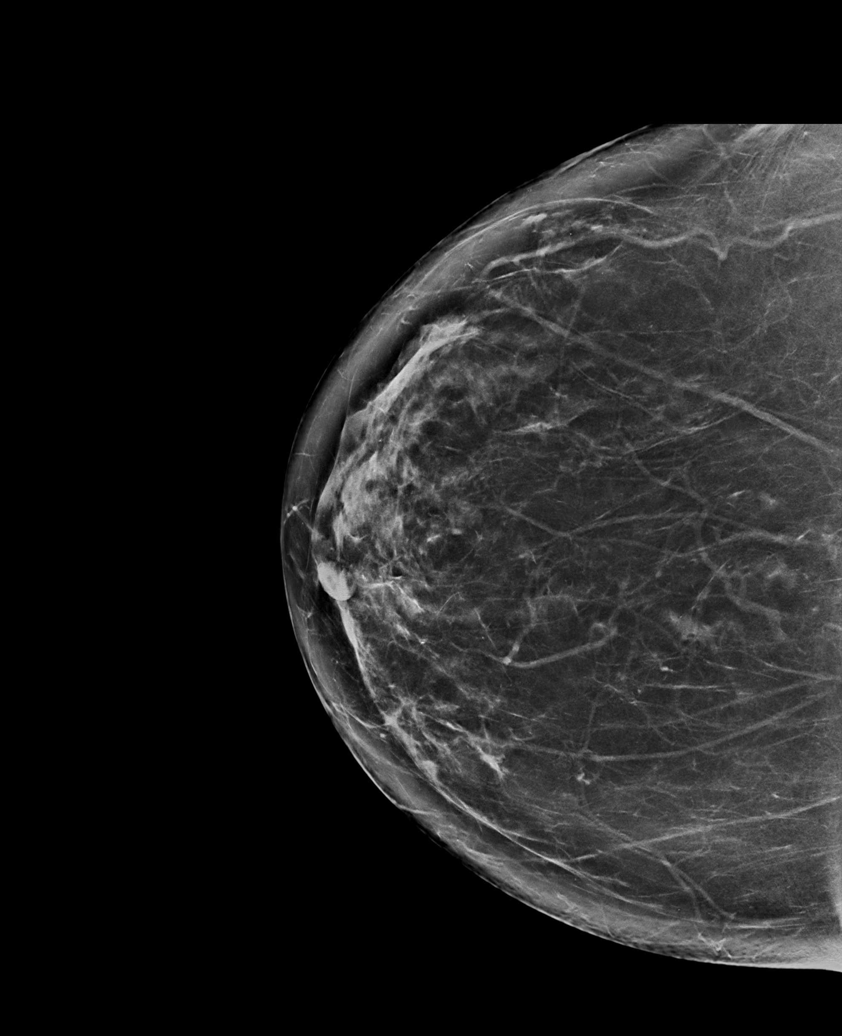

[R MLO synth-2D]
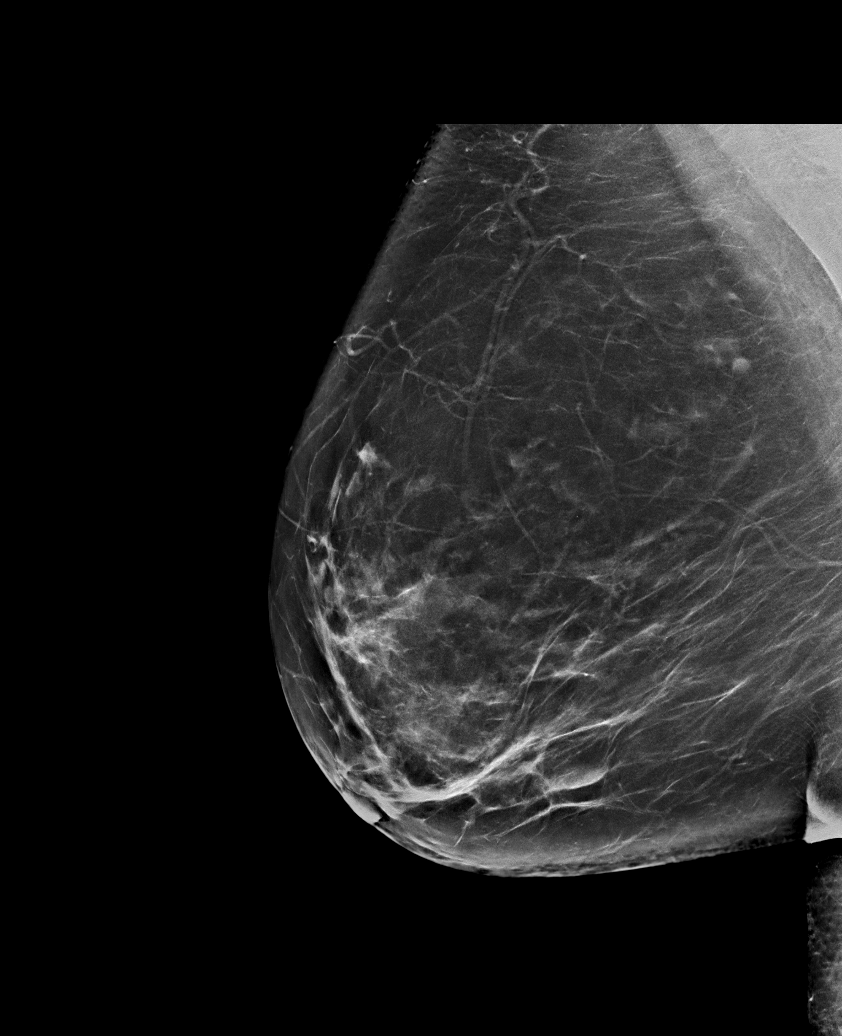

[L CC synth-2D]
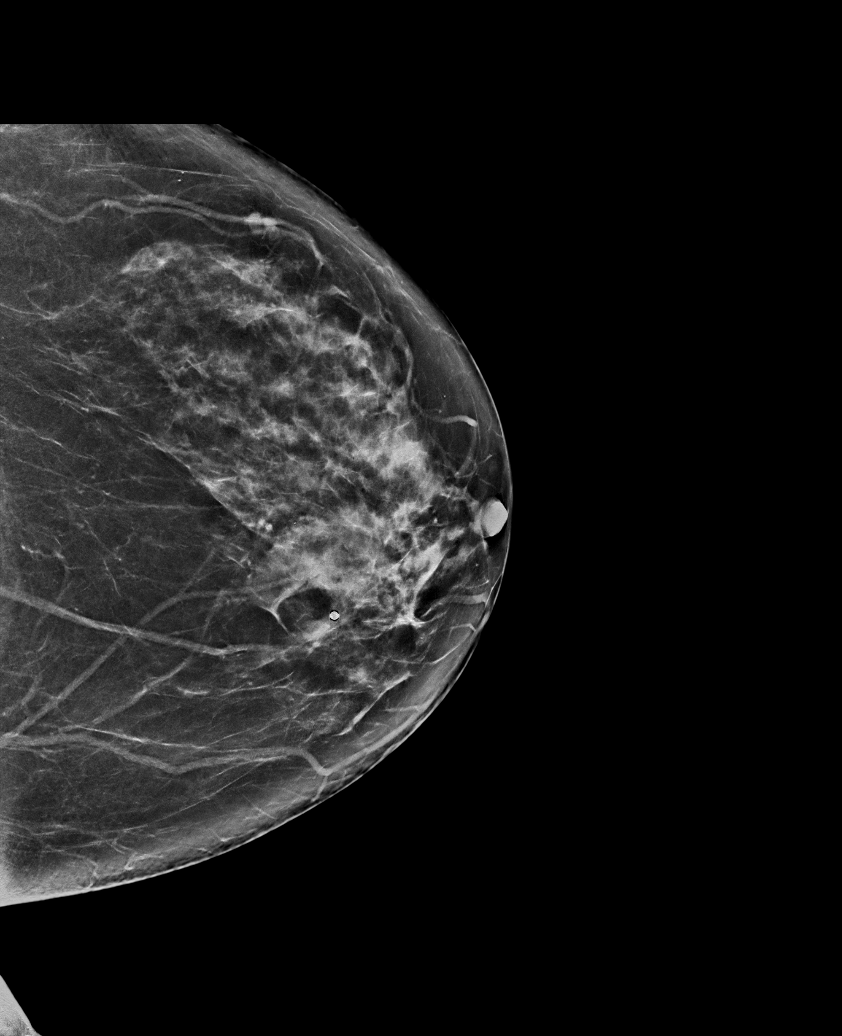

[L MLO synth-2D]
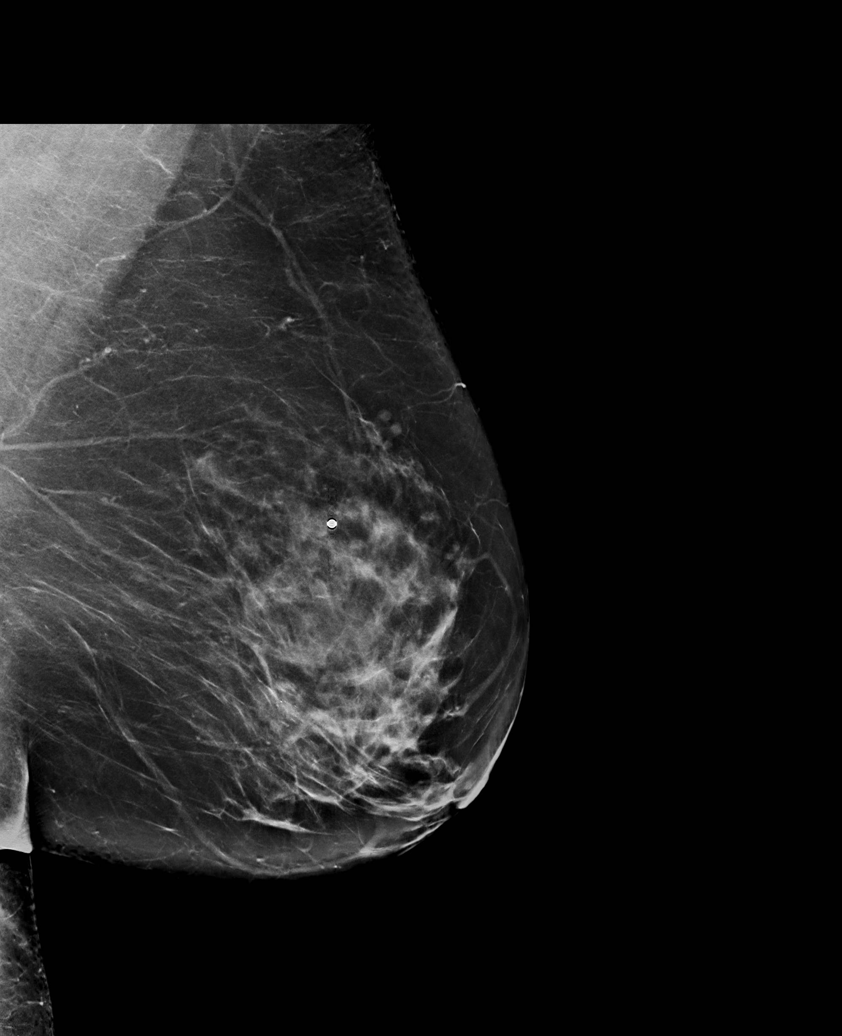

[L TAN synth-2D]
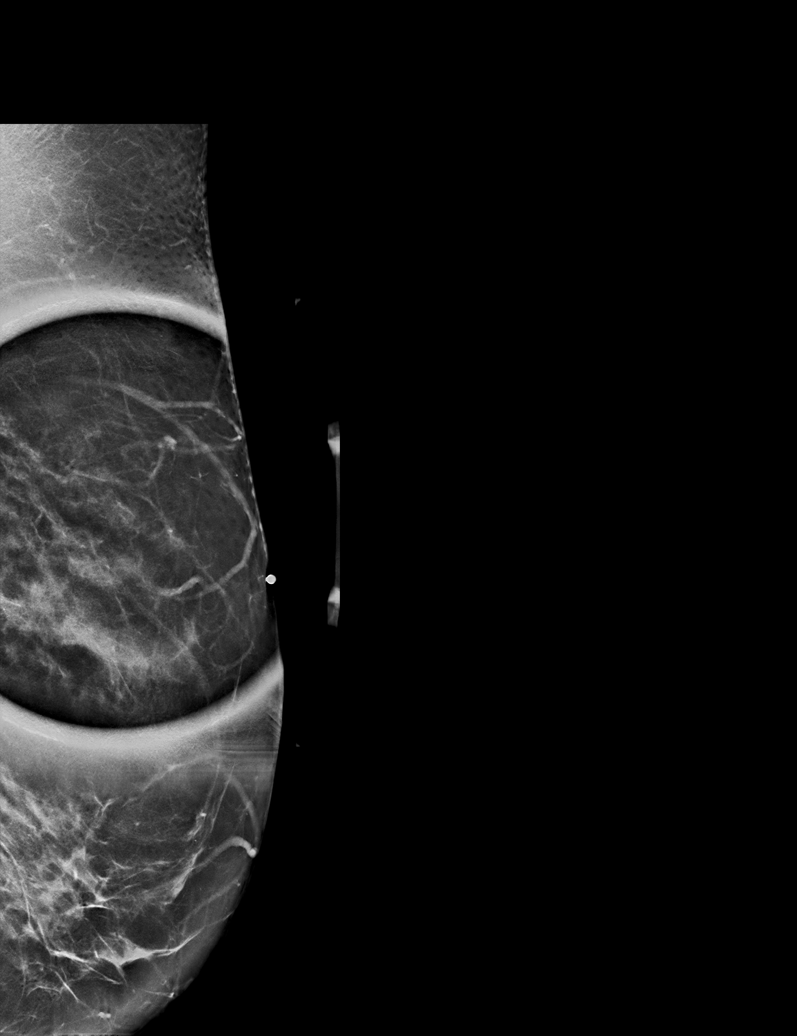

[L MLO tomo · tomo slice 47/93.0]
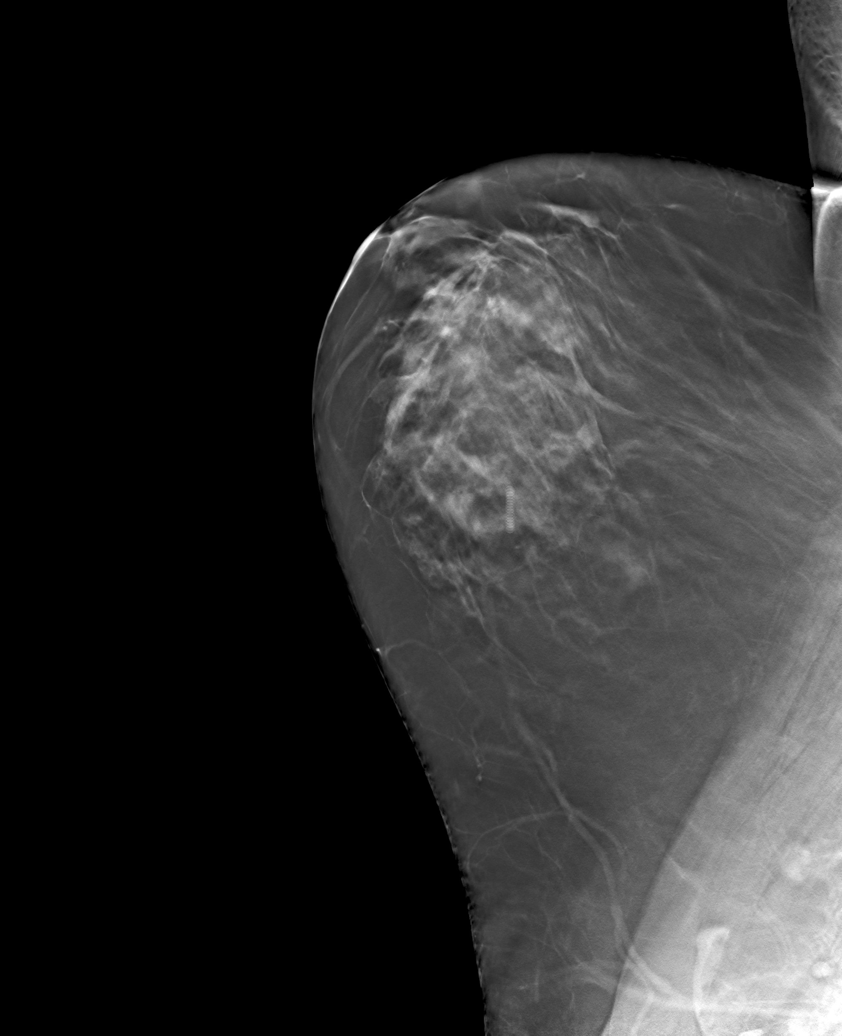

[6 of 30 positions shown; findings below may reference images not displayed]

ACR Breast Density Category b: There are scattered areas of
fibroglandular density.
FINDINGS: Spot compression tomosynthesis views were obtained over the palpable
area of concern in the LEFT breast. No suspicious mammographic
finding is identified in this area. No suspicious mass,
microcalcification, or other finding is identified in either breast.

On physical exam, there is a superficial area of skin thickening
appreciated. Patient reports mild tenderness at the site.

Targeted LEFT breast ultrasound was performed in the palpable area
of concern at the upper breast. No suspicious solid or cystic mass
is identified. There is focal skin thickening without suspicious
subjacent mass at the site of palpable concern.
IMPRESSION: 1. There is focal skin thickening at the site of palpable concern
without suspicious subjacent mass. Findings likely reflect the
sequela of inflammatory papule or inflamed epidermal inclusion cyst.
Any further workup of the patient's symptoms should be based on the
clinical assessment. Recommend routine annual screening mammogram in
1 year.
2. No mammographic evidence of malignancy bilaterally.

RECOMMENDATION:
Screening mammogram in one year.(Code:[id number])

I have discussed the findings and recommendations with the patient.
If applicable, a reminder letter will be sent to the patient
regarding the next appointment.

BI-RADS CATEGORY  2: Benign.

## 2023-08-19 NOTE — Patient Instructions (Signed)
 Be Involved in Caring For Your Health:  Taking Medications When medications are taken as directed, they can greatly improve your health. But if they are not taken as prescribed, they may not work. In some cases, not taking them correctly can be harmful. To help ensure your treatment remains effective and safe, understand your medications and how to take them. Bring your medications to each visit for review by your provider.  Your lab results, notes, and after visit summary will be available on My Chart. We strongly encourage you to use this feature. If lab results are abnormal the clinic will contact you with the appropriate steps. If the clinic does not contact you assume the results are satisfactory. You can always view your results on My Chart. If you have questions regarding your health or results, please contact the clinic during office hours. You can also ask questions on My Chart.  We at [facility] are grateful that you chose [place] to provide your care. We strive to provide evidence-based and compassionate care and are always looking for feedback. If you get a survey from the clinic please complete this so we can hear your opinions.  Healthy Eating, Adult Healthy eating may help you get and keep a healthy body weight, reduce the risk of chronic disease, and live a long and productive life. It is important to follow a healthy eating pattern. Your nutritional and calorie needs should be met mainly by different nutrient-rich foods. What are tips for following this plan? Reading food labels Read labels and choose the following: Reduced or low sodium products. Juices with 100% fruit juice. Foods with low saturated fats (<3 g per serving) and high polyunsaturated and monounsaturated fats. Foods with whole grains, such as whole wheat, cracked wheat, brown rice, and wild rice. Whole grains that are fortified with folic acid. This is recommended for females who are pregnant or who want  to become pregnant. Read labels and do not eat or drink the following: Foods or drinks with added sugars. These include foods that contain brown sugar, corn sweetener, corn syrup, dextrose, fructose, glucose, high-fructose corn syrup, honey, invert sugar, lactose, malt syrup, maltose, molasses, raw sugar, sucrose, trehalose, or turbinado sugar. Limit your intake of added sugars to less than 10% of your total daily calories. Do not eat more than the following amounts of added sugar per day: 6 teaspoons (25 g) for females. 9 teaspoons (38 g) for males. Foods that contain processed or refined starches and grains. Refined grain products, such as white flour, degermed cornmeal, white bread, and white rice. Shopping Choose nutrient-rich snacks, such as vegetables, whole fruits, and nuts. Avoid high-calorie and high-sugar snacks, such as potato chips, fruit snacks, and candy. Use oil-based dressings and spreads on foods instead of solid fats such as butter, margarine, sour cream, or cream cheese. Limit pre-made sauces, mixes, and "instant" products such as flavored rice, instant noodles, and ready-made pasta. Try more plant-protein sources, such as tofu, tempeh, black beans, edamame, lentils, nuts, and seeds. Explore eating plans such as the Mediterranean diet or vegetarian diet. Try heart-healthy dips made with beans and healthy fats like hummus and guacamole. Vegetables go great with these. Cooking Use oil to saut or stir-fry foods instead of solid fats such as butter, margarine, or lard. Try baking, boiling, grilling, or broiling instead of frying. Remove the fatty part of meats before cooking. Steam vegetables in water or broth. Meal planning  At meals, imagine dividing your plate into fourths: One-half of  your plate is fruits and vegetables. One-fourth of your plate is whole grains. One-fourth of your plate is protein, especially lean meats, poultry, eggs, tofu, beans, or nuts. Include  low-fat dairy as part of your daily diet. Lifestyle Choose healthy options in all settings, including home, work, school, restaurants, or stores. Prepare your food safely: Wash your hands after handling raw meats. Where you prepare food, keep surfaces clean by regularly washing with hot, soapy water. Keep raw meats separate from ready-to-eat foods, such as fruits and vegetables. Cook seafood, meat, poultry, and eggs to the recommended temperature. Get a food thermometer. Store foods at safe temperatures. In general: Keep cold foods at 76F (4.4C) or below. Keep hot foods at 176F (60C) or above. Keep your freezer at [facility] (-17.8C) or below. Foods are not safe to eat if they have been between the temperatures of 40-176F (4.4-60C) for more than 2 hours. What foods should I eat? Fruits Aim to eat 1-2 cups of fresh, canned (in natural juice), or frozen fruits each day. One cup of fruit equals 1 small apple, 1 large banana, 8 large strawberries, 1 cup (237 g) canned fruit,  cup (82 g) dried fruit, or 1 cup (240 mL) 100% juice. Vegetables Aim to eat 2-4 cups of fresh and frozen vegetables each day, including different varieties and colors. One cup of vegetables equals 1 cup (91 g) broccoli or cauliflower florets, 2 medium carrots, 2 cups (150 g) raw, leafy greens, 1 large tomato, 1 large bell pepper, 1 large sweet potato, or 1 medium white potato. Grains Aim to eat 5-10 ounce-equivalents of whole grains each day. Examples of 1 ounce-equivalent of grains include 1 slice of bread, 1 cup (40 g) ready-to-eat cereal, 3 cups (24 g) popcorn, or  cup (93 g) cooked rice. Meats and other proteins Try to eat 5-7 ounce-equivalents of protein each day. Examples of 1 ounce-equivalent of protein include 1 egg,  oz nuts (12 almonds, 24 pistachios, or 7 walnut halves), 1/4 cup (90 g) cooked beans, 6 tablespoons (90 g) hummus or 1 tablespoon (16 g) peanut butter. A cut of meat or fish that is the size of a deck  of cards is about 3-4 ounce-equivalents (85 g). Of the protein you eat each week, try to have at least 8 sounce (227 g) of seafood. This is about 2 servings per week. This includes salmon, trout, herring, sardines, and anchovies. Dairy Aim to eat 3 cup-equivalents of fat-free or low-fat dairy each day. Examples of 1 cup-equivalent of dairy include 1 cup (240 mL) milk, 8 ounces (250 g) yogurt, 1 ounces (44 g) natural cheese, or 1 cup (240 mL) fortified soy milk. Fats and oils Aim for about 5 teaspoons (21 g) of fats and oils per day. Choose monounsaturated fats, such as canola and olive oils, mayonnaise made with olive oil or avocado oil, avocados, peanut butter, and most nuts, or polyunsaturated fats, such as sunflower, corn, and soybean oils, walnuts, pine nuts, sesame seeds, sunflower seeds, and flaxseed. Beverages Aim for 6 eight-ounce glasses of water per day. Limit coffee to 3-5 eight-ounce cups per day. Limit caffeinated beverages that have added calories, such as soda and energy drinks. If you drink alcohol: Limit how much you have to: 0-1 drink a day if you are female. 0-2 drinks a day if you are female. Know how much alcohol is in your drink. In the [place] one drink is one 12 oz bottle of beer (355 mL), one 5 oz glass of wine (  148 mL), or one 1 oz glass of hard liquor (44 mL). Seasoning and other foods Try not to add too much salt to your food. Try using herbs and spices instead of salt. Try not to add sugar to food. This information is based on [place] nutrition guidelines. To learn more, visit [employer]posableN[employer].be. Exact amounts may vary. You may need different amounts. This information is not intended to replace advice given to you by your health care provider. Make sure you discuss any questions you have with your health care provider. Document Revised: 11/15/2021 Document Reviewed: 11/15/2021 Elsevier Patient Education  2024 [employer].

## 2023-08-25 ENCOUNTER — Encounter: Payer: Self-pay | Admitting: Nurse Practitioner

## 2023-08-25 ENCOUNTER — Ambulatory Visit (INDEPENDENT_AMBULATORY_CARE_PROVIDER_SITE_OTHER): Payer: Self-pay | Admitting: Nurse Practitioner

## 2023-08-25 VITALS — BP 121/72 | HR 64 | Temp 97.8°F | Ht 65.0 in | Wt 174.2 lb

## 2023-08-25 DIAGNOSIS — J301 Allergic rhinitis due to pollen: Secondary | ICD-10-CM

## 2023-08-25 DIAGNOSIS — E559 Vitamin D deficiency, unspecified: Secondary | ICD-10-CM

## 2023-08-25 DIAGNOSIS — Z0000 Encounter for general adult medical examination without abnormal findings: Secondary | ICD-10-CM

## 2023-08-25 DIAGNOSIS — E7800 Pure hypercholesterolemia, unspecified: Secondary | ICD-10-CM | POA: Diagnosis not present

## 2023-08-25 DIAGNOSIS — Z1231 Encounter for screening mammogram for malignant neoplasm of breast: Secondary | ICD-10-CM | POA: Diagnosis not present

## 2023-08-25 MED ORDER — MONTELUKAST SODIUM 10 MG PO TABS: ORAL_TABLET | ORAL | 4 refills | Status: AC

## 2023-08-25 NOTE — Assessment & Plan Note (Signed)
Chronic, ongoing taking supplement.  Recheck level today.

## 2023-08-25 NOTE — Progress Notes (Signed)
 BP 121/72 (BP Location: Left Arm, Patient Position: Sitting)   Pulse 64   Temp 97.8 F (36.6 C) (Oral)   Ht 5' 5 (1.651 m)   Wt 174 lb 3.2 oz (79 kg)   LMP  (LMP Unknown)   BMI 28.99 kg/m    Subjective:    Patient ID: Jeanette Henry, female    DOB: [birth year], 63 y.o.   MRN: [id number]  HPI: Jeanette Henry is a 63 y.o. female presenting on 08/25/2023 for comprehensive medical examination. Current medical complaints include:none  She currently lives with: significant other Menopausal Symptoms: no  Takes Vitamin D  supplement for history of lows.  Continues Singulair  and Flonase  for allergic rhinitis.  Occasionally takes OTC antihistamines for further relief.  Had episode of bronchitis in March.  The 10-year ASCVD risk score (Arnett DK, et al., 2019) is: 4%   Values used to calculate the score:     Age: 21 years     Clincally relevant sex: Female     Is Non-Hispanic African American: No     Diabetic: No     Tobacco smoker: No     Systolic Blood Pressure: 121 mmHg     Is BP treated: No     HDL Cholesterol: 55 mg/dL     Total Cholesterol: 229 mg/dL     3/72/7974    1:83 AM 08/23/2022    8:31 AM 01/07/2022   10:53 AM 08/20/2021    8:13 AM 08/17/2020    8:11 AM  Depression screen PHQ 2/9  Decreased Interest 0 0 0 0 0  Down, Depressed, Hopeless 0 0 0 0 0  PHQ - 2 Score 0 0 0 0 0  Altered sleeping 0 0 0 0   Tired, decreased energy 0 1 1 1    Change in appetite 0 0 1 0   Feeling bad or failure about yourself  1 0 0 0   Trouble concentrating 0 0 0 0   Moving slowly or fidgety/restless 0 0 0 0   Suicidal thoughts 0 0 0 0   PHQ-9 Score 1 1 2 1    Difficult doing work/chores Not difficult at all Not difficult at all Not difficult at all Not difficult at all        08/25/2023    8:17 AM 08/23/2022    8:31 AM 01/07/2022   10:54 AM 08/20/2021    8:13 AM  GAD 7 : Generalized Anxiety Score  Nervous, Anxious, on Edge 0 0 0 0  Control/stop worrying 1 0 0 1  Worry too much  - different things 1 0 1 1  Trouble relaxing 0 0 0 1  Restless 0 0 0 0  Easily annoyed or irritable 0 0 0 0  Afraid - awful might happen 0 1 0 1  Total GAD 7 Score 2 1 1 4   Anxiety Difficulty Not difficult at all Not difficult at all  Somewhat difficult        04/19/2017    4:16 PM 08/17/2020    8:10 AM 08/20/2021    8:13 AM 01/07/2022   10:53 AM 08/25/2023    8:17 AM  Fall Risk  Falls in the past year? No  0 0 0 0  Was there an injury with Fall?  0 0 0 0  Fall Risk Category Calculator  0 0 0 0  Fall Risk Category (Retired)  Low  Low  Low    (RETIRED) Patient Fall Risk Level  Low fall  risk  Low fall risk     Patient at Risk for Falls Due to  No Fall Risks No Fall Risks No Fall Risks No Fall Risks  Fall risk Follow up  Falls evaluation completed  Falls evaluation completed  Falls evaluation completed  Falls evaluation completed     Data saved with a previous flowsheet row definition     Past Medical History:  Past Medical History:  Diagnosis Date   Allergy    Vitamin D  deficiency     Surgical History:  Past Surgical History:  Procedure Laterality Date   COLONOSCOPY WITH PROPOFOL  N/A 08/11/2017   Procedure: COLONOSCOPY WITH PROPOFOL ;  Surgeon: Janalyn Keene NOVAK, MD;  Location: [facility] ENDOSCOPY;  Service: Gastroenterology;  Laterality: N/A;   COLONOSCOPY WITH PROPOFOL  N/A 12/05/2022   Procedure: COLONOSCOPY WITH PROPOFOL ;  Surgeon: Unk Corinn Skiff, MD;  Location: [facility] ENDOSCOPY;  Service: Gastroenterology;  Laterality: N/A;   TUBAL LIGATION     WRIST SURGERY Left     Medications:  Current Outpatient Medications on File Prior to Visit  Medication Sig   Acetaminophen (TYLENOL PO) Take by mouth daily.   fluticasone  (FLONASE ) 50 MCG/ACT nasal spray Place 1 spray into both nostrils 2 (two) times a day.   VITAMIN D , CHOLECALCIFEROL, PO Take 2,000 Units by mouth daily.   No current facility-administered medications on file prior to visit.    Allergies:  No Known  Allergies  Social History:  Social History   Socioeconomic History   Marital status: Married    Spouse name: Not on file   Number of children: Not on file   Years of education: Not on file   Highest education level: Not on file  Occupational History   Not on file  Tobacco Use   Smoking status: Never   Smokeless tobacco: Never  Vaping Use   Vaping status: Never Used  Substance and Sexual Activity   Alcohol use: No    Alcohol/week: 0.0 standard drinks of alcohol   Drug use: No   Sexual activity: Yes    Birth control/protection: Surgical  Other Topics Concern   Not on file  Social History Narrative   Not on file   Social Drivers of Health   Financial Resource Strain: Low Risk  (07/03/2023)   Received from [facility]   Overall Financial Resource Strain (CARDIA)    Difficulty of Paying Living Expenses: Not hard at all  Food Insecurity: No Food Insecurity (07/03/2023)   Received from [facility]   Hunger Vital Sign    Within the past 12 months, you worried that your food would run out before you got the money to buy more.: Never true    Within the past 12 months, the food you bought just didn't last and you didn't have money to get more.: Never true  Transportation Needs: No Transportation Needs (07/03/2023)   Received from [facility] - Transportation    In the past 12 months, has lack of transportation kept you from medical appointments or from getting medications?: No    Lack of Transportation (Non-Medical): No  Physical Activity: Insufficiently Active (08/25/2023)   Exercise Vital Sign    Days of Exercise per Week: 4 days    Minutes of Exercise per Session: 30 min  Stress: No Stress Concern Present (08/25/2023)   [employer] of Occupational Health - Occupational Stress Questionnaire    Feeling of Stress: Not at all  Social Connections: Socially  Integrated (08/25/2023)   Social Connection and Isolation  Panel    Frequency of Communication with Friends and Family: More than three times a week    Frequency of Social Gatherings with Friends and Family: Once a week    Attends Religious Services: More than 4 times per year    Active Member of [employer] or Organizations: Yes    Attends Engineer, structural: More than 4 times per year    Marital Status: Married  Catering manager Violence: Not on file   Social History   Tobacco Use  Smoking Status Never  Smokeless Tobacco Never   Social History   Substance and Sexual Activity  Alcohol Use No   Alcohol/week: 0.0 standard drinks of alcohol    Family History:  Family History  Problem Relation Age of Onset   Hypertension Mother    Cancer Mother        skin   Heart disease Father    Breast cancer Neg Hx     Past medical history, surgical history, medications, allergies, family history and social history reviewed with patient today and changes made to appropriate areas of the chart.   ROS All other ROS negative except what is listed above and in the HPI.      Objective:    BP 121/72 (BP Location: Left Arm, Patient Position: Sitting)   Pulse 64   Temp 97.8 F (36.6 C) (Oral)   Ht 5' 5 (1.651 m)   Wt 174 lb 3.2 oz (79 kg)   LMP  (LMP Unknown)   BMI 28.99 kg/m   Wt Readings from Last 3 Encounters:  08/25/23 174 lb 3.2 oz (79 kg)  12/05/22 179 lb (81.2 kg)  08/23/22 193 lb (87.5 kg)    Physical Exam Vitals and nursing note reviewed. Exam conducted with a chaperone present.  Constitutional:      General: She is awake. She is not in acute distress.    Appearance: She is well-developed and well-groomed. She is not ill-appearing or toxic-appearing.  HENT:     Head: Normocephalic and atraumatic.     Right Ear: Hearing, tympanic membrane, ear canal and external ear normal. No drainage.     Left Ear: Hearing, tympanic membrane, ear canal and external ear normal. No drainage.     Nose: Nose normal.     Right Sinus: No  maxillary sinus tenderness or frontal sinus tenderness.     Left Sinus: No maxillary sinus tenderness or frontal sinus tenderness.     Mouth/Throat:     Mouth: Mucous membranes are moist.     Pharynx: Oropharynx is clear. Uvula midline. No pharyngeal swelling, oropharyngeal exudate or posterior oropharyngeal erythema.   Eyes:     General: Lids are normal.        Right eye: No discharge.        Left eye: No discharge.     Extraocular Movements: Extraocular movements intact.     Conjunctiva/sclera: Conjunctivae normal.     Pupils: Pupils are equal, round, and reactive to light.     Visual Fields: Right eye visual fields normal and left eye visual fields normal.   Neck:     Thyroid: No thyromegaly.     Vascular: No carotid bruit.     Trachea: Trachea normal.   Cardiovascular:     Rate and Rhythm: Normal rate and regular rhythm.     Heart sounds: Normal heart sounds. No murmur heard.    No gallop.  Pulmonary:  Effort: Pulmonary effort is normal. No accessory muscle usage or respiratory distress.     Breath sounds: Normal breath sounds.  Chest:  Breasts:    Right: Normal.     Left: Normal.  Abdominal:     General: Bowel sounds are normal.     Palpations: Abdomen is soft. There is no hepatomegaly or splenomegaly.     Tenderness: There is no abdominal tenderness.   Musculoskeletal:        General: Normal range of motion.     Cervical back: Normal range of motion and neck supple.     Right lower leg: No edema.     Left lower leg: No edema.  Lymphadenopathy:     Head:     Right side of head: No submental, submandibular, tonsillar, preauricular or posterior auricular adenopathy.     Left side of head: No submental, submandibular, tonsillar, preauricular or posterior auricular adenopathy.     Cervical: No cervical adenopathy.     Upper Body:     Right upper body: No supraclavicular, axillary or pectoral adenopathy.     Left upper body: No supraclavicular, axillary or pectoral  adenopathy.   Skin:    General: Skin is warm and dry.     Capillary Refill: Capillary refill takes less than 2 seconds.     Findings: No rash.   Neurological:     Mental Status: She is alert and oriented to person, place, and time.     Gait: Gait is intact.     Deep Tendon Reflexes: Reflexes are normal and symmetric.     Reflex Scores:      Brachioradialis reflexes are 2+ on the right side and 2+ on the left side.      Patellar reflexes are 2+ on the right side and 2+ on the left side.  Psychiatric:        Attention and Perception: Attention normal.        Mood and Affect: Mood normal.        Speech: Speech normal.        Behavior: Behavior normal. Behavior is cooperative.        Thought Content: Thought content normal.        Judgment: Judgment normal.    Results for orders placed or performed in visit on 08/23/22  CBC with Differential/Platelet   Collection Time: 08/23/22  8:15 AM  Result Value Ref Range   WBC 6.3 3.4 - 10.8 x10E3/uL   RBC 4.54 3.77 - 5.28 x10E6/uL   Hemoglobin 13.8 11.1 - 15.9 g/dL   Hematocrit 58.4 65.9 - 46.6 %   MCV 91 79 - 97 fL   MCH 30.4 26.6 - 33.0 pg   MCHC 33.3 31.5 - 35.7 g/dL   RDW 87.1 88.2 - 84.5 %   Platelets 233 150 - 450 x10E3/uL   Neutrophils 57 Not Estab. %   Lymphs 34 Not Estab. %   Monocytes 6 Not Estab. %   Eos 2 Not Estab. %   Basos 1 Not Estab. %   Neutrophils Absolute 3.6 1.4 - 7.0 x10E3/uL   Lymphocytes Absolute 2.1 0.7 - 3.1 x10E3/uL   Monocytes Absolute 0.4 0.1 - 0.9 x10E3/uL   EOS (ABSOLUTE) 0.1 0.0 - 0.4 x10E3/uL   Basophils Absolute 0.0 0.0 - 0.2 x10E3/uL   Immature Granulocytes 0 Not Estab. %   Immature Grans (Abs) 0.0 0.0 - 0.1 x10E3/uL  Comprehensive metabolic panel   Collection Time: 08/23/22  8:15 AM  Result Value  Ref Range   Glucose 94 70 - 99 mg/dL   BUN 18 8 - 27 mg/dL   Creatinine, Ser 9.36 0.57 - 1.00 mg/dL   eGFR 898 >40 fO/fpw/8.26   BUN/Creatinine Ratio 29 (H) 12 - 28   Sodium 140 134 - 144 mmol/L    Potassium 4.4 3.5 - 5.2 mmol/L   Chloride 103 96 - 106 mmol/L   CO2 24 20 - 29 mmol/L   Calcium 9.4 8.7 - 10.3 mg/dL   Total Protein 6.2 6.0 - 8.5 g/dL   Albumin 4.4 3.9 - 4.9 g/dL   Globulin, Total 1.8 1.5 - 4.5 g/dL   Bilirubin Total 0.3 0.0 - 1.2 mg/dL   Alkaline Phosphatase 94 44 - 121 IU/L   AST 21 0 - 40 IU/L   ALT 20 0 - 32 IU/L  TSH   Collection Time: 08/23/22  8:15 AM  Result Value Ref Range   TSH 1.760 0.450 - 4.500 uIU/mL  Lipid Panel w/o Chol/HDL Ratio   Collection Time: 08/23/22  8:15 AM  Result Value Ref Range   Cholesterol, Total 229 (H) 100 - 199 mg/dL   Triglycerides 805 (H) 0 - 149 mg/dL   HDL 55 >60 mg/dL   VLDL Cholesterol Cal 35 5 - 40 mg/dL   LDL Chol Calc (NIH) 860 (H) 0 - 99 mg/dL  VITAMIN D  25 Hydroxy (Vit-D Deficiency, Fractures)   Collection Time: 08/23/22  8:15 AM  Result Value Ref Range   Vit D, 25-Hydroxy 21.1 (L) 30.0 - 100.0 ng/mL      Assessment & Plan:   Problem List Items Addressed This Visit       Respiratory   Allergic rhinitis   Chronic, ongoing issue.  Continue Singulair  which is offering some benefit + continue Flonase  and Claritin OTC.  Discussed BLACK BOX warning with her.  Could consider referral to allergist if ongoing issues with allergies, has seen ENT in past with assessment - but no allergy testing.      Relevant Orders   CBC with Differential/Platelet     Other   Vitamin D  deficiency   Chronic, ongoing taking supplement.  Recheck level today.      Relevant Orders   VITAMIN D  25 Hydroxy (Vit-D Deficiency, Fractures)   Elevated low density lipoprotein (LDL) cholesterol level - Primary   Noted on past labs, ASCVD 4%. Continue diet focus and recheck labs today.      Relevant Orders   Comprehensive metabolic panel with GFR   Lipid Panel w/o Chol/HDL Ratio   Other Visit Diagnoses       Encounter for screening mammogram for malignant neoplasm of breast       Mammogram ordered and instructed on how to schedule.    Relevant Orders   MM 3D SCREENING MAMMOGRAM BILATERAL BREAST     Encounter for annual physical exam       Annual physical today with labs and health maintenance reviewed, discussed with patient.   Relevant Orders   TSH        Follow up plan: No follow-ups on file.   LABORATORY TESTING:  - Pap smear: up to date  IMMUNIZATIONS:   - Tdap: Tetanus vaccination status reviewed: last tetanus booster within 10 years. - Influenza: Up to date - Pneumovax: Not applicable - Prevnar: Not applicable - COVID: Up to date - HPV: Not applicable - Shingrix vaccine: Refused  SCREENING: -Mammogram: Ordered today due after July 30th - Colonoscopy: Up To Date - Bone Density:  Not applicable  -Hearing Test: Not applicable  -Spirometry: Not applicable   PATIENT COUNSELING:   Advised to take 1 mg of folate supplement per day if capable of pregnancy.   Sexuality: Discussed sexually transmitted diseases, partner selection, use of condoms, avoidance of unintended pregnancy  and contraceptive alternatives.   Advised to avoid cigarette smoking.  I discussed with the patient that most people either abstain from alcohol or drink within safe limits (<=14/week and <=4 drinks/occasion for males, <=7/weeks and <= 3 drinks/occasion for females) and that the risk for alcohol disorders and other health effects rises proportionally with the number of drinks per week and how often a drinker exceeds daily limits.  Discussed cessation/primary prevention of drug use and availability of treatment for abuse.   Diet: Encouraged to adjust caloric intake to maintain  or achieve ideal body weight, to reduce intake of dietary saturated fat and total fat, to limit sodium intake by avoiding high sodium foods and not adding table salt, and to maintain adequate dietary potassium and calcium preferably from fresh fruits, vegetables, and low-fat dairy products.    Stressed the importance of regular exercise  Injury prevention:  Discussed safety belts, safety helmets, smoke detector, smoking near bedding or upholstery.   Dental health: Discussed importance of regular tooth brushing, flossing, and dental visits.    NEXT PREVENTATIVE PHYSICAL DUE IN 1 YEAR. No follow-ups on file.

## 2023-08-25 NOTE — Assessment & Plan Note (Signed)
 Noted on past labs, ASCVD 4%. Continue diet focus and recheck labs today.

## 2023-08-25 NOTE — Assessment & Plan Note (Signed)
Chronic, ongoing issue.  Continue Singulair which is offering some benefit + continue Flonase and Claritin OTC.  Discussed BLACK BOX warning with her.  Could consider referral to allergist if ongoing issues with allergies, has seen ENT in past with assessment - but no allergy testing.

## 2023-08-26 ENCOUNTER — Ambulatory Visit: Payer: Self-pay | Admitting: Nurse Practitioner

## 2023-08-26 LAB — LIPID PANEL W/O CHOL/HDL RATIO
Cholesterol, Total: 222 mg/dL — ABNORMAL HIGH (ref 100–199)
HDL: 53 mg/dL (ref >39)
LDL Chol Calc (NIH): 146 mg/dL — ABNORMAL HIGH (ref 0–99)
Triglycerides: 128 mg/dL (ref 0–149)
VLDL Cholesterol Cal: 23 mg/dL (ref 5–40)

## 2023-08-26 LAB — CBC WITH DIFFERENTIAL/PLATELET
Basophils Absolute: 0.1 10*3/uL (ref 0.0–0.2)
Basos: 1 %
EOS (ABSOLUTE): 0.2 10*3/uL (ref 0.0–0.4)
Eos: 3 %
Hematocrit: 42.9 % (ref 34.0–46.6)
Hemoglobin: 14.2 g/dL (ref 11.1–15.9)
Immature Grans (Abs): 0 10*3/uL (ref 0.0–0.1)
Immature Granulocytes: 0 %
Lymphocytes Absolute: 1.8 10*3/uL (ref 0.7–3.1)
Lymphs: 34 %
MCH: 31.8 pg (ref 26.6–33.0)
MCHC: 33.1 g/dL (ref 31.5–35.7)
MCV: 96 fL (ref 79–97)
Monocytes Absolute: 0.4 10*3/uL (ref 0.1–0.9)
Monocytes: 7 %
Neutrophils Absolute: 2.9 10*3/uL (ref 1.4–7.0)
Neutrophils: 55 %
Platelets: 252 10*3/uL (ref 150–450)
RBC: 4.47 x10E6/uL (ref 3.77–5.28)
RDW: 13.1 % (ref 11.7–15.4)
WBC: 5.3 10*3/uL (ref 3.4–10.8)

## 2023-08-26 LAB — COMPREHENSIVE METABOLIC PANEL WITH GFR
ALT: 16 IU/L (ref 0–32)
AST: 25 IU/L (ref 0–40)
Albumin: 4.4 g/dL (ref 3.9–4.9)
Alkaline Phosphatase: 76 IU/L (ref 44–121)
BUN/Creatinine Ratio: 13 (ref 12–28)
BUN: 10 mg/dL (ref 8–27)
Bilirubin Total: 0.3 mg/dL (ref 0.0–1.2)
CO2: 24 mmol/L (ref 20–29)
Calcium: 9.4 mg/dL (ref 8.7–10.3)
Chloride: 103 mmol/L (ref 96–106)
Creatinine, Ser: 0.77 mg/dL (ref 0.57–1.00)
Globulin, Total: 1.9 g/dL (ref 1.5–4.5)
Glucose: 87 mg/dL (ref 70–99)
Potassium: 4.2 mmol/L (ref 3.5–5.2)
Sodium: 141 mmol/L (ref 134–144)
Total Protein: 6.3 g/dL (ref 6.0–8.5)
eGFR: 87 mL/min/{1.73_m2} (ref >59)

## 2023-08-26 LAB — VITAMIN D 25 HYDROXY (VIT D DEFICIENCY, FRACTURES): Vit D, 25-Hydroxy: 36.5 ng/mL (ref 30.0–100.0)

## 2023-08-26 LAB — TSH: TSH: 1.38 u[IU]/mL (ref 0.450–4.500)

## 2023-08-26 NOTE — Progress Notes (Signed)
 Good morning, please let Dameshia know her labs have returned and overall continue to look great with exception of Lipid panel which continues to show elevations.  No medications needed at this time.  Please continue heavy focus on regular exercise and healthy diet.  Any questions? Keep being amazing!!  Thank you for allowing me to participate in your care.  I appreciate you. Kindest regards, Pranshu Lyster

## 2023-09-28 ENCOUNTER — Ambulatory Visit
Admission: RE | Admit: 2023-09-28 | Discharge: 2023-09-28 | Disposition: A | Discharge status: Home / Self Care | Source: Ambulatory Visit | Attending: Nurse Practitioner | Admitting: Nurse Practitioner

## 2023-09-28 DIAGNOSIS — Z1231 Encounter for screening mammogram for malignant neoplasm of breast: Secondary | ICD-10-CM | POA: Diagnosis present

## 2023-10-02 NOTE — Progress Notes (Signed)
 Contacted via MyChart   Normal mammogram, may repeat in one year:)

## 2024-09-02 ENCOUNTER — Encounter: Admitting: Nurse Practitioner
